# Patient Record
Sex: Female | Born: 2000 | Race: White | Hispanic: No | Marital: Single | Smoking: Never smoker
Health system: Southern US, Community
[De-identification: ages and names within clinical notes are randomized; demographics above are authoritative.]

---
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
## 2021-01-18 ENCOUNTER — Ambulatory Visit (INDEPENDENT_AMBULATORY_CARE_PROVIDER_SITE_OTHER): Payer: 59 | Admitting: Clinical

## 2021-01-18 ENCOUNTER — Ambulatory Visit: Payer: 59 | Admitting: Clinical

## 2021-01-18 DIAGNOSIS — F4323 Adjustment disorder with mixed anxiety and depressed mood: Secondary | ICD-10-CM

## 2021-01-22 ENCOUNTER — Ambulatory Visit: Payer: 59 | Admitting: Clinical

## 2021-01-29 ENCOUNTER — Other Ambulatory Visit: Payer: Self-pay

## 2021-01-29 ENCOUNTER — Emergency Department (HOSPITAL_COMMUNITY)
Admission: EM | Admit: 2021-01-29 | Discharge: 2021-01-30 | Disposition: A | Discharge status: Other Acute Inpatient Hospital | Payer: 59 | Attending: Emergency Medicine | Admitting: Emergency Medicine

## 2021-01-29 DIAGNOSIS — F332 Major depressive disorder, recurrent severe without psychotic features: Secondary | ICD-10-CM | POA: Insufficient documentation

## 2021-01-29 DIAGNOSIS — F909 Attention-deficit hyperactivity disorder, unspecified type: Secondary | ICD-10-CM | POA: Diagnosis not present

## 2021-01-29 DIAGNOSIS — R000 Tachycardia, unspecified: Secondary | ICD-10-CM | POA: Insufficient documentation

## 2021-01-29 DIAGNOSIS — T450X2A Poisoning by antiallergic and antiemetic drugs, intentional self-harm, initial encounter: Secondary | ICD-10-CM | POA: Diagnosis present

## 2021-01-29 DIAGNOSIS — Z20822 Contact with and (suspected) exposure to covid-19: Secondary | ICD-10-CM | POA: Diagnosis not present

## 2021-01-29 DIAGNOSIS — R45851 Suicidal ideations: Secondary | ICD-10-CM

## 2021-01-29 DIAGNOSIS — T50902A Poisoning by unspecified drugs, medicaments and biological substances, intentional self-harm, initial encounter: Secondary | ICD-10-CM

## 2021-01-29 LAB — RESP PANEL BY RT-PCR (FLU A&B, COVID) ARPGX2
Influenza A by PCR: NEGATIVE
Influenza B by PCR: NEGATIVE
SARS Coronavirus 2 by RT PCR: NEGATIVE

## 2021-01-29 LAB — ACETAMINOPHEN LEVEL
Acetaminophen (Tylenol), Serum: <10 ug/mL — ABNORMAL LOW (ref 10–30)
Acetaminophen (Tylenol), Serum: <10 ug/mL — ABNORMAL LOW (ref 10–30)

## 2021-01-29 LAB — RAPID URINE DRUG SCREEN, HOSP PERFORMED
Amphetamines: NOT DETECTED
Barbiturates: NOT DETECTED
Benzodiazepines: NOT DETECTED
Cocaine: NOT DETECTED
Opiates: NOT DETECTED
Tetrahydrocannabinol: NOT DETECTED

## 2021-01-29 LAB — SALICYLATE LEVEL: Salicylate Lvl: <7 mg/dL — ABNORMAL LOW (ref 7.0–30.0)

## 2021-01-29 LAB — COMPREHENSIVE METABOLIC PANEL
ALT: 27 U/L (ref 0–44)
AST: 46 U/L — ABNORMAL HIGH (ref 15–41)
Albumin: 5.6 g/dL — ABNORMAL HIGH (ref 3.5–5.0)
Alkaline Phosphatase: 62 U/L (ref 38–126)
Anion gap: 13 (ref 5–15)
BUN: 9 mg/dL (ref 6–20)
CO2: 25 mmol/L (ref 22–32)
Calcium: 9.8 mg/dL (ref 8.9–10.3)
Chloride: 101 mmol/L (ref 98–111)
Creatinine, Ser: 0.85 mg/dL (ref 0.44–1.00)
GFR, Estimated: >60 mL/min (ref >60)
Glucose, Bld: 95 mg/dL (ref 70–99)
Potassium: 4.5 mmol/L (ref 3.5–5.1)
Sodium: 139 mmol/L (ref 135–145)
Total Bilirubin: 1.2 mg/dL (ref 0.3–1.2)
Total Protein: 9.6 g/dL — ABNORMAL HIGH (ref 6.5–8.1)

## 2021-01-29 LAB — CBC
HCT: 47.2 % — ABNORMAL HIGH (ref 36.0–46.0)
Hemoglobin: 17 g/dL — ABNORMAL HIGH (ref 12.0–15.0)
MCH: 32.6 pg (ref 26.0–34.0)
MCHC: 36 g/dL (ref 30.0–36.0)
MCV: 90.6 fL (ref 80.0–100.0)
Platelets: 198 10*3/uL (ref 150–400)
RBC: 5.21 MIL/uL — ABNORMAL HIGH (ref 3.87–5.11)
RDW: 12.1 % (ref 11.5–15.5)
WBC: 5.9 10*3/uL (ref 4.0–10.5)
nRBC: 0 % (ref 0.0–0.2)

## 2021-01-29 LAB — I-STAT BETA HCG BLOOD, ED (MC, WL, AP ONLY): I-stat hCG, quantitative: <5 m[IU]/mL (ref <5)

## 2021-01-29 LAB — ETHANOL: Alcohol, Ethyl (B): <10 mg/dL (ref <10)

## 2021-01-29 MED ORDER — SODIUM CHLORIDE 0.9 % IV BOLUS
500.0000 mL | Freq: Once | INTRAVENOUS | Status: AC
  Administered 2021-01-29: 500 mL via INTRAVENOUS

## 2021-01-29 MED ORDER — SODIUM CHLORIDE 0.9 % IV BOLUS
1000.0000 mL | Freq: Once | INTRAVENOUS | Status: AC
  Administered 2021-01-29: 1000 mL via INTRAVENOUS

## 2021-01-29 MED ORDER — ESCITALOPRAM OXALATE 10 MG PO TABS
10.0000 mg | ORAL_TABLET | Freq: Every day | ORAL | Status: DC
  Administered 2021-01-30: 10 mg via ORAL
  Filled 2021-01-29: qty 1

## 2021-01-29 NOTE — ED Notes (Signed)
Called poison control. Spoke with Raynelle Fanning. Poison control recommends q4 acetaminophen level checks; draw LFT's If acetaminophen level is concerning. If QRS> 140 then give bicarbonate boluses, repeat EKG post each bolus completion. Recommends giving 1L of IV fluids for tachycardia

## 2021-01-29 NOTE — ED Provider Notes (Signed)
[facility]-EMERGENCY DEPT Provider Note   CSN: [id number] Arrival date & time: 01/29/21  7867     History Chief Complaint  Patient presents with  . Ingestion  . Suicide Attempt    Linda Franco is a 20 y.o. female with past medical history of anxiety, depression, ADHD, adjustment disorder, presenting to the emergency department from home with her mother for intentional overdose on Benadryl.  Patient took 18 tabs of 25 mg Benadryl around 130 to 2 PM today.  Patient states she was feeling depressed, and took this medication in an attempt to end her life.  She denies any other ingestions.  Denies drug or alcohol use.  Her current symptoms include dizziness.  His mother reports patient was recently found to be on the autism spectrum.  She has been taking Lexapro for 1 year now, mother believes she is been taking the medication, she administers it daily.  She has previously cut herself, however never overdosed intentionally.  The history is provided by the patient and a parent.       No past medical history on file.  Patient Active Problem List   Diagnosis Date Noted  . ADHD (attention deficit hyperactivity disorder), inattentive type 09/30/2019  . Depression 05/20/2019  . Anxiety disorder 05/20/2019  . Adjustment disorder with mixed anxiety and depressed mood 05/20/2019    No past surgical history on file.   OB History   No obstetric history on file.     No family history on file.  Social History   Tobacco Use  . Smoking status: Never Smoker  . Smokeless tobacco: Never Used    Home Medications Prior to Admission medications   Medication Sig Start Date End Date Taking? Authorizing Provider  escitalopram (LEXAPRO) 10 MG tablet TAKE 1 TABLET BY MOUTH EVERY DAY 12/21/20   Paretta-Leahey, Miachel Roux, NP    Allergies    Patient has no known allergies.  Review of Systems   Review of Systems  Neurological: Positive for dizziness.   Psychiatric/Behavioral: Positive for dysphoric mood and suicidal ideas.  All other systems reviewed and are negative.   Physical Exam Updated Vital Signs BP 132/87   Pulse (!) 102   Temp 97.7 F (36.5 C) (Oral)   Resp (!) 23   Ht 5\' 6"  (1.676 m)   Wt 53.3 kg   SpO2 100%   BMI 18.97 kg/m   Physical Exam Vitals and nursing note reviewed.  Constitutional:      Appearance: She is well-developed and well-nourished.  HENT:     Head: Normocephalic and atraumatic.  Eyes:     Extraocular Movements: Extraocular movements intact.     Conjunctiva/sclera: Conjunctivae normal.     Pupils: Pupils are equal, round, and reactive to light.  Cardiovascular:     Rate and Rhythm: Regular rhythm. Tachycardia present.  Pulmonary:     Effort: Pulmonary effort is normal. No respiratory distress.     Breath sounds: Normal breath sounds.  Abdominal:     General: Bowel sounds are normal.     Palpations: Abdomen is soft.     Tenderness: There is no abdominal tenderness.  Skin:    General: Skin is warm.  Neurological:     Mental Status: She is alert.  Psychiatric:        Mood and Affect: Mood and affect normal. Affect is flat.        Speech: Speech is delayed.        Behavior: Behavior normal.  Thought Content: Thought content includes suicidal ideation. Thought content includes suicidal plan.     ED Results / Procedures / Treatments   Labs (all labs ordered are listed, but only abnormal results are displayed) Labs Reviewed  COMPREHENSIVE METABOLIC PANEL - Abnormal; Notable for the following components:      Result Value   Total Protein 9.6 (*)    Albumin 5.6 (*)    AST 46 (*)    All other components within normal limits  CBC - Abnormal; Notable for the following components:   RBC 5.21 (*)    Hemoglobin 17.0 (*)    HCT 47.2 (*)    All other components within normal limits  ACETAMINOPHEN LEVEL - Abnormal; Notable for the following components:   Acetaminophen (Tylenol), Serum  <10 (*)    All other components within normal limits  SALICYLATE LEVEL - Abnormal; Notable for the following components:   Salicylate Lvl <7.0 (*)    All other components within normal limits  RESP PANEL BY RT-PCR (FLU A&B, COVID) ARPGX2  RAPID URINE DRUG SCREEN, HOSP PERFORMED  ETHANOL  I-STAT BETA HCG BLOOD, ED ([facility], [facility], [facility] ONLY)    EKG None  Radiology No results found.  Procedures Procedures   Medications Ordered in ED Medications  sodium chloride 0.9 % bolus 1,000 mL (0 mLs Intravenous Stopped 01/29/21 1923)    ED Course  I have reviewed the triage vital signs and the nursing notes.  Pertinent labs & imaging results that were available during my care of the patient were reviewed by me and considered in my medical decision making (see chart for details).  Clinical Course as of 01/29/21 2228  Fri Jan 29, 2021  2214 Acetaminophen (Tylenol), S(!): <10 Initial and repeat negative [HM]  2215 Pulse Rate(!): 102 Tachycardia persists.  No urinary retention.  Will give small fluid bolus. [HM]  2215 Hemoglobin(!): 17.0 Somewhat hemoconcentrated [HM]  2221 EKG 12-Lead No prolonged QRS on repeat ECG [HM]    Clinical Course User Index [HM] Muthersbaugh, Boyd Kerbs   MDM Rules/Calculators/A&P                          Patient presenting with mother for intentional overdose with Benadryl.  Patient took 18 tabs of 25 mg Benadryl from 1:32 PM today.  Endorses some dizziness on evaluation.  This ingestion was intentional suicide attempt.  We will continue to monitor.  EKG with narrow complex tachycardia.  Tachycardia improving with IV fluids.  Patient remains alert.  Labs with signs of mild dehydration, though should correct with fluids.  Negative hCG.  UDS is negative.  Tylenol levels are negative. Repeat EKG is unchanged.  Patient is medically cleared for TTS evaluation.  Currently voluntary though will need IVC if patient decides she no longer wants to stay, as she poses a risk  to herself.  Pending TTS evaluation, care is handed off at shift change to  Muthersbaugh, PA-C  Final Clinical Impression(s) / ED Diagnoses Final diagnoses:  OD (overdose of drug), intentional self-harm, initial encounter [facility])    Rx / DC Orders ED Discharge Orders    None       Robinson, [place] N, PA-C 01/29/21 2228    Gerhard Munch, MD 01/31/21 2214

## 2021-01-29 NOTE — ED Notes (Signed)
Spoke to [employer] from [employer]. She recommends to perform another EKG at 1:15am. Look at the QRS widening and if it is greater than 140, give Sodium Bicarb Bolus starting at 1-2 mEq/kg.

## 2021-01-29 NOTE — ED Notes (Signed)
Poison Control called and gave update on patient.

## 2021-01-29 NOTE — ED Provider Notes (Cosign Needed Addendum)
Care assumed from [facility].  Please see her full H&P.  In short,  Linda Franco is a 20 y.o. female presents for intentional overdose on benadryl around 1pm this afternoon. Initial provider considered patient stable for TTS evaluation.  Repeat tyelnol level (4 hr) pending.  No additional monitoring recommended by poison control. Pt is voluntary - will need IVC if she attempts to leave. Mother at bedside. Repeat ECG pending.  Physical Exam  BP 132/87   Pulse (!) 102   Temp 97.7 F (36.5 C) (Oral)   Resp (!) 23   Ht 5\' 6"  (1.676 m)   Wt 53.3 kg   SpO2 100%   BMI 18.97 kg/m   Physical Exam Vitals and nursing note reviewed.  Constitutional:      General: She is not in acute distress.    Appearance: She is well-developed and well-nourished.  HENT:     Head: Normocephalic.  Eyes:     General: No scleral icterus.    Conjunctiva/sclera: Conjunctivae normal.  Cardiovascular:     Rate and Rhythm: Normal rate.     Pulses: Intact distal pulses.     Comments: Tachycardia resolved Pulmonary:     Effort: Pulmonary effort is normal.  Musculoskeletal:        General: Normal range of motion.     Cervical back: Normal range of motion.  Skin:    General: Skin is warm and dry.  Neurological:     Mental Status: She is alert.     ED Course/Procedures   Clinical Course as of 01/30/21 0651  Fri Jan 29, 2021  2214 Acetaminophen (Tylenol), S(!): <10 Initial and repeat negative [HM]  2215 Pulse Rate(!): 102 Tachycardia persists.  No urinary retention.  Will give small fluid bolus. [HM]  2215 Hemoglobin(!): 17.0 Somewhat hemoconcentrated [HM]  2221 EKG 12-Lead No prolonged QRS on repeat ECG [HM]    Clinical Course User Index [HM] 2222     EKG Interpretation  Date/Time:  Friday January 29 2021 21:14:11 EST Ventricular Rate:  97 PR Interval:    QRS Duration: 117 QT Interval:  379 QTC Calculation: 482 R Axis:   90 Text Interpretation: Sinus rhythm  Incomplete right bundle branch block Since last tracing rate slower Confirmed by 05-21-2005 [phone]) on 01/30/2021 4:51:20 AM        EKG Interpretation  Date/Time:  Saturday January 30 2021 01:23:29 EST Ventricular Rate:  70 PR Interval:    QRS Duration: 123 QT Interval:  442 QTC Calculation: 477 R Axis:   88 Text Interpretation: Sinus rhythm Right bundle branch block Since last tracing rate slower Otherwise no significant change Confirmed by 06-27-1969 [phone]) on 01/30/2021 4:51:47 AM       EKG Interpretation  Date/Time:  Saturday January 30 2021 04:49:04 EST Ventricular Rate:  57 PR Interval:    QRS Duration: 125 QT Interval:  465 QTC Calculation: 453 R Axis:   88 Text Interpretation: Sinus rhythm  RBBB No acute changes Confirmed by 06-27-1969 [phone]) on 01/30/2021 4:53:51 AM      Procedures  MDM   Presents after intentional overdose.  Vital signs have improved after fluids.  Repeat EKG without prolonged QRS.  Per [facility] patient is cleared to be evaluated and placed by TTS.  Pending TTS assessment.  Discussed plan with patient and mother along with review of labs.  They are in agreement.  Questions answered.  4:43 AM TTS recommends: inpatient treatment  Discussed with  Cody, PA-C - concerned about ECG changes - will repeat.  QT prolongation expected with benadryl overdose, but is continuing to decrease.  QRS remains long as would be expected with Benadryl overdose.  Discussed with poison control who reports they would have expected this to have decreased.  Will repeat EKG in 4 more hours @ 9am   BP 102/68   Pulse (!) 56   Temp 98.9 F (37.2 C) (Oral)   Resp 13   Ht 5\' 6"  (1.676 m)   Wt 53.3 kg   SpO2 99%   BMI 18.97 kg/m   6:34 AM At shift change care was transferred to [facility] who will follow pending studies, re-evaulate and determine disposition.     OD (overdose of drug), intentional self-harm, initial encounter  [facility])  Suicidal ideation       Muthersbaugh, [facility] 01/30/21 02/01/21    1791, MD 01/31/21 [phone]

## 2021-01-29 NOTE — ED Triage Notes (Addendum)
Pt came from home via POV. Pt states presence of suicidal ideation for years. Today, ingested 18 tabs of 25 mg of benadryl with intent to harm self. Pt states this is the first action she has taken regarding SI. Mom bedside. PMH of autism

## 2021-01-30 ENCOUNTER — Encounter (HOSPITAL_COMMUNITY): Payer: Self-pay | Admitting: Psychiatry

## 2021-01-30 ENCOUNTER — Inpatient Hospital Stay (HOSPITAL_COMMUNITY)
Admission: AD | Admit: 2021-01-30 | Discharge: 2021-02-03 | DRG: 885 | Disposition: A | Discharge status: Home / Self Care | Payer: 59 | Source: Intra-hospital | Attending: Psychiatry | Admitting: Psychiatry

## 2021-01-30 ENCOUNTER — Other Ambulatory Visit: Payer: Self-pay

## 2021-01-30 DIAGNOSIS — F411 Generalized anxiety disorder: Secondary | ICD-10-CM | POA: Diagnosis present

## 2021-01-30 DIAGNOSIS — F909 Attention-deficit hyperactivity disorder, unspecified type: Secondary | ICD-10-CM | POA: Diagnosis present

## 2021-01-30 DIAGNOSIS — I4510 Unspecified right bundle-branch block: Secondary | ICD-10-CM | POA: Diagnosis present

## 2021-01-30 DIAGNOSIS — G4700 Insomnia, unspecified: Secondary | ICD-10-CM | POA: Diagnosis present

## 2021-01-30 DIAGNOSIS — Z20822 Contact with and (suspected) exposure to covid-19: Secondary | ICD-10-CM | POA: Diagnosis present

## 2021-01-30 DIAGNOSIS — T450X2A Poisoning by antiallergic and antiemetic drugs, intentional self-harm, initial encounter: Secondary | ICD-10-CM | POA: Diagnosis present

## 2021-01-30 DIAGNOSIS — F332 Major depressive disorder, recurrent severe without psychotic features: Principal | ICD-10-CM | POA: Diagnosis present

## 2021-01-30 DIAGNOSIS — F900 Attention-deficit hyperactivity disorder, predominantly inattentive type: Secondary | ICD-10-CM | POA: Diagnosis present

## 2021-01-30 DIAGNOSIS — F329 Major depressive disorder, single episode, unspecified: Secondary | ICD-10-CM | POA: Insufficient documentation

## 2021-01-30 LAB — HEMOGLOBIN A1C
Hgb A1c MFr Bld: 4.5 % — ABNORMAL LOW (ref 4.8–5.6)
Mean Plasma Glucose: 82.45 mg/dL

## 2021-01-30 MED ORDER — ALUM & MAG HYDROXIDE-SIMETH 200-200-20 MG/5ML PO SUSP: 30.0000 mL | ORAL | Status: DC | PRN

## 2021-01-30 MED ORDER — MAGNESIUM HYDROXIDE 400 MG/5ML PO SUSP: 30.0000 mL | Freq: Every day | ORAL | Status: DC | PRN

## 2021-01-30 MED ORDER — ACETAMINOPHEN 325 MG PO TABS
650.0000 mg | ORAL_TABLET | Freq: Four times a day (QID) | ORAL | Status: DC | PRN
  Administered 2021-01-31 – 2021-02-01 (×2): 650 mg via ORAL
  Filled 2021-01-30 (×2): qty 2

## 2021-01-30 MED ORDER — TRAZODONE HCL 50 MG PO TABS
25.0000 mg | ORAL_TABLET | Freq: Every evening | ORAL | Status: DC | PRN
  Administered 2021-01-31 – 2021-02-02 (×3): 25 mg via ORAL
  Filled 2021-01-30 (×4): qty 1

## 2021-01-30 MED ORDER — ENSURE ENLIVE PO LIQD
237.0000 mL | Freq: Two times a day (BID) | ORAL | Status: DC
  Administered 2021-01-30 – 2021-01-31 (×2): 237 mL via ORAL
  Filled 2021-01-30 (×6): qty 237

## 2021-01-30 MED ORDER — HYDROXYZINE HCL 25 MG PO TABS
25.0000 mg | ORAL_TABLET | Freq: Three times a day (TID) | ORAL | Status: DC | PRN
  Administered 2021-01-30 – 2021-02-02 (×4): 25 mg via ORAL
  Filled 2021-01-30 (×5): qty 1

## 2021-01-30 MED ORDER — ESCITALOPRAM OXALATE 10 MG PO TABS
10.0000 mg | ORAL_TABLET | Freq: Two times a day (BID) | ORAL | Status: DC
  Administered 2021-01-30 – 2021-02-03 (×8): 10 mg via ORAL
  Filled 2021-01-30 (×14): qty 1

## 2021-01-30 NOTE — H&P (Signed)
Psychiatric Admission Assessment Adult  Patient Identification: Linda Franco MRN:  [id number] Date of Evaluation:  01/30/2021 Chief Complaint:  MDD (major depressive disorder), recurrent episode, severe (HCC) [F33.2] Major depression [F32.9] Principal Diagnosis: <principal problem not specified> Diagnosis:  Active Problems:   MDD (major depressive disorder), recurrent episode, severe (HCC)   Major depression  History of Present Illness: Patient is seen and examined.  Patient is an 19 year old female who presented to the [facility] emergency department on 01/29/2021 and stated that she had been suicidal for years.  She had ingested 18 tablets of 25 mg Benadryl tablets.  She has a reported past psychiatric history significant for autism.  Her comprehensive clinical assessment note on 2/26 stated the patient had been feeling depressed secondary to several stressors.  She told me this morning that she was having problems because of "friends".  She stated she was sad.  She admitted that she was taking Lexapro for depression from a psychiatrist.  She was unsure who the psychiatrist was.  She also stated that she was in therapy.  She apparently had had recent testing that showed that she was autistic.  She stated that this was her first psychiatric admission.  She was admitted to the hospital for evaluation and stabilization.  Associated Signs/Symptoms: Depression Symptoms:  depressed mood, anhedonia, fatigue, feelings of worthlessness/guilt, difficulty concentrating, hopelessness, suicidal thoughts with specific plan, suicidal attempt, anxiety, loss of energy/fatigue, disturbed sleep, Duration of Depression Symptoms: Greater than two weeks  (Hypo) Manic Symptoms:  Impulsivity, Anxiety Symptoms:  Excessive Worry, Psychotic Symptoms:  Denied Duration of Psychotic Symptoms: No data recorded PTSD Symptoms: Had a traumatic exposure:  Patient admitted to traumatic event  in the past, but preferred not to discuss it on her initial evaluation. Total Time spent with patient: 45 minutes  Past Psychiatric History: Patient has been diagnosed with attention deficit hyperactivity disorder as well as generalized anxiety disorder.  This is her first psychiatric hospitalization.  She admitted to cutting as an adolescent and being in therapy at that time.  She stated she had not cut in approximately a year.  She is being treated with Lexapro and is in psychotherapy.  She had previously been treated with methylphenidate extended release for her ADHD.  Is the patient at risk to self? Yes.    Has the patient been a risk to self in the past 6 months? Yes.    Has the patient been a risk to self within the distant past? No.  Is the patient a risk to others? No.  Has the patient been a risk to others in the past 6 months? No.  Has the patient been a risk to others within the distant past? No.   Prior Inpatient Therapy:   Prior Outpatient Therapy:    Alcohol Screening: 1. How often do you have a drink containing alcohol?: Never 2. How many drinks containing alcohol do you have on a typical day when you are drinking?: 1 or 2 3. How often do you have six or more drinks on one occasion?: Never AUDIT-C Score: 0 4. How often during the last year have you found that you were not able to stop drinking once you had started?: Never 5. How often during the last year have you failed to do what was normally expected from you because of drinking?: Never 6. How often during the last year have you needed a first drink in the morning to get yourself going after a heavy drinking session?:  Never 7. How often during the last year have you had a feeling of guilt of remorse after drinking?: Never 8. How often during the last year have you been unable to remember what happened the night before because you had been drinking?: Never 9. Have you or someone else been injured as a result of your  drinking?: No 10. Has a relative or friend or a doctor or another health worker been concerned about your drinking or suggested you cut down?: No Alcohol Use Disorder Identification Test Final Score (AUDIT): 0 Alcohol Brief Interventions/Follow-up: AUDIT Score <7 follow-up not indicated Substance Abuse History in the last 12 months:  No. Consequences of Substance Abuse: Negative Previous Psychotropic Medications: Yes  Psychological Evaluations: Yes  Past Medical History: History reviewed. No pertinent past medical history. History reviewed. No pertinent surgical history. Family History: History reviewed. No pertinent family history. Family Psychiatric  History: She stated that her mother had an unspecified psychiatric illness, and her father had substance issues and bipolar disorder but did not seek treatment. Tobacco Screening:   Social History:  Social History   Substance and Sexual Activity  Alcohol Use None     Social History   Substance and Sexual Activity  Drug Use Not on file    Additional Social History:                           Allergies:  No Known Allergies Lab Results:  Results for orders placed or performed during the hospital encounter of 01/29/21 (from the past 48 hour(s))  Acetaminophen level     Status: Abnormal   Collection Time: 01/29/21  4:52 PM  Result Value Ref Range   Acetaminophen (Tylenol), Serum <10 (L) 10 - 30 ug/mL    Comment: (NOTE) Therapeutic concentrations vary significantly. A range of 10-30 ug/mL  may be an effective concentration for many patients. However, some  are best treated at concentrations outside of this range. Acetaminophen concentrations >150 ug/mL at 4 hours after ingestion  and >50 ug/mL at 12 hours after ingestion are often associated with  toxic reactions.  Performed at [facility], [address] [place], [place] [address]   Ethanol     Status: None   Collection Time: 01/29/21  4:52 PM   Result Value Ref Range   Alcohol, Ethyl (B) <10 <10 mg/dL    Comment: (NOTE) Lowest detectable limit for serum alcohol is 10 mg/dL.  For medical purposes only. Performed at [facility], [address] [place], [place] [address]   Salicylate level     Status: Abnormal   Collection Time: 01/29/21  4:52 PM  Result Value Ref Range   Salicylate Lvl <7.0 (L) 7.0 - 30.0 mg/dL    Comment: Performed at [facility], [address] [place], [place] [address]  Comprehensive metabolic panel     Status: Abnormal   Collection Time: 01/29/21  4:56 PM  Result Value Ref Range   Sodium 139 135 - 145 mmol/L   Potassium 4.5 3.5 - 5.1 mmol/L   Chloride 101 98 - 111 mmol/L   CO2 25 22 - 32 mmol/L   Glucose, Bld 95 70 - 99 mg/dL    Comment: Glucose reference range applies only to samples taken after fasting for at least 8 hours.   BUN 9 6 - 20 mg/dL   Creatinine, Ser 9.140.85 0.44 - 1.00 mg/dL   Calcium 9.8 8.9 - 78.210.3 mg/dL  Total Protein 9.6 (H) 6.5 - 8.1 g/dL   Albumin 5.6 (H) 3.5 - 5.0 g/dL   AST 46 (H) 15 - 41 U/L   ALT 27 0 - 44 U/L   Alkaline Phosphatase 62 38 - 126 U/L   Total Bilirubin 1.2 0.3 - 1.2 mg/dL   GFR, Estimated >70 >35 mL/min    Comment: (NOTE) Calculated using the CKD-EPI Creatinine Equation (2021)    Anion gap 13 5 - 15    Comment: Performed at [facility], [address], [place], [place] [address]  cbc     Status: Abnormal   Collection Time: 01/29/21  4:56 PM  Result Value Ref Range   WBC 5.9 4.0 - 10.5 K/uL   RBC 5.21 (H) 3.87 - 5.11 MIL/uL   Hemoglobin 17.0 (H) 12.0 - 15.0 g/dL   HCT 18.2 (H) 99.3 - 71.6 %   MCV 90.6 80.0 - 100.0 fL   MCH 32.6 26.0 - 34.0 pg   MCHC 36.0 30.0 - 36.0 g/dL   RDW 96.7 89.3 - 81.0 %   Platelets 198 150 - 400 K/uL   nRBC 0.0 0.0 - 0.2 %    Comment: Performed at [facility], [address] [place], [place] [address]  I-Stat beta hCG blood, ED     Status:  None   Collection Time: 01/29/21  5:02 PM  Result Value Ref Range   I-stat hCG, quantitative <5.0 <5 mIU/mL   Comment 3            Comment:   GEST. AGE      CONC.  (mIU/mL)   <=1 WEEK        5 - 50     2 WEEKS       50 - 500     3 WEEKS       100 - 10,000     4 WEEKS     1,000 - 30,000        FEMALE AND NON-PREGNANT FEMALE:     LESS THAN 5 mIU/mL   Rapid urine drug screen (hospital performed)     Status: None   Collection Time: 01/29/21  5:15 PM  Result Value Ref Range   Opiates NONE DETECTED NONE DETECTED   Cocaine NONE DETECTED NONE DETECTED   Benzodiazepines NONE DETECTED NONE DETECTED   Amphetamines NONE DETECTED NONE DETECTED   Tetrahydrocannabinol NONE DETECTED NONE DETECTED   Barbiturates NONE DETECTED NONE DETECTED    Comment: (NOTE) DRUG SCREEN FOR MEDICAL PURPOSES ONLY.  IF CONFIRMATION IS NEEDED FOR ANY PURPOSE, NOTIFY LAB WITHIN 5 DAYS.  LOWEST DETECTABLE LIMITS FOR URINE DRUG SCREEN Drug Class                     Cutoff (ng/mL) Amphetamine and metabolites    1000 Barbiturate and metabolites    200 Benzodiazepine                 200 Tricyclics and metabolites     300 Opiates and metabolites        300 Cocaine and metabolites        300 THC                            50 Performed at [facility], [address] [place], [place] [address]   Resp Panel by RT-PCR (Flu A&B, Covid) Nasopharyngeal Swab  Status: None   Collection Time: 01/29/21  5:42 PM   Specimen: Nasopharyngeal Swab; Nasopharyngeal(NP) swabs in vial transport medium  Result Value Ref Range   SARS Coronavirus 2 by RT PCR NEGATIVE NEGATIVE    Comment: (NOTE) SARS-CoV-2 target nucleic acids are NOT DETECTED.  The SARS-CoV-2 RNA is generally detectable in upper respiratory specimens during the acute phase of infection. The lowest concentration of SARS-CoV-2 viral copies this assay can detect is 138 copies/mL. A negative result does not preclude SARS-Cov-2 infection and  should not be used as the sole basis for treatment or other patient management decisions. A negative result may occur with  improper specimen collection/handling, submission of specimen other than nasopharyngeal swab, presence of viral mutation(s) within the areas targeted by this assay, and inadequate number of viral copies(<138 copies/mL). A negative result must be combined with clinical observations, patient history, and epidemiological information. The expected result is Negative.  Fact Sheet for Patients:  BloggerCourse.com  Fact Sheet for Healthcare Providers:  SeriousBroker.it  This test is no t yet approved or cleared by the [place] FDA and  has been authorized for detection and/or diagnosis of SARS-CoV-2 by FDA under an Emergency Use Authorization (EUA). This EUA will remain  in effect (meaning this test can be used) for the duration of the COVID-19 declaration under Section 564(b)(1) of the Act, 21 U.S.C.section 360bbb-3(b)(1), unless the authorization is terminated  or revoked sooner.       Influenza A by PCR NEGATIVE NEGATIVE   Influenza B by PCR NEGATIVE NEGATIVE    Comment: (NOTE) The Xpert Xpress SARS-CoV-2/FLU/RSV plus assay is intended as an aid in the diagnosis of influenza from Nasopharyngeal swab specimens and should not be used as a sole basis for treatment. Nasal washings and aspirates are unacceptable for Xpert Xpress SARS-CoV-2/FLU/RSV testing.  Fact Sheet for Patients: BloggerCourse.com  Fact Sheet for Healthcare Providers: SeriousBroker.it  This test is not yet approved or cleared by the [place] FDA and has been authorized for detection and/or diagnosis of SARS-CoV-2 by FDA under an Emergency Use Authorization (EUA). This EUA will remain in effect (meaning this test can be used) for the duration of the COVID-19 declaration under Section  564(b)(1) of the Act, 21 U.S.C. section 360bbb-3(b)(1), unless the authorization is terminated or revoked.  Performed at [facility], [address], [place], [place] [address]   Acetaminophen level     Status: Abnormal   Collection Time: 01/29/21  8:51 PM  Result Value Ref Range   Acetaminophen (Tylenol), Serum <10 (L) 10 - 30 ug/mL    Comment: (NOTE) Therapeutic concentrations vary significantly. A range of 10-30 ug/mL  may be an effective concentration for many patients. However, some  are best treated at concentrations outside of this range. Acetaminophen concentrations >150 ug/mL at 4 hours after ingestion  and >50 ug/mL at 12 hours after ingestion are often associated with  toxic reactions.  Performed at [facility], [address], [place], [place] [address]     Blood Alcohol level:  Lab Results  Component Value Date   ETH <10 01/29/2021    Metabolic Disorder Labs:  No results found for: HGBA1C, MPG No results found for: PROLACTIN No results found for: CHOL, TRIG, HDL, CHOLHDL, VLDL, LDLCALC  Current Medications: Current Facility-Administered Medications  Medication Dose Route Frequency Provider Last Rate Last Admin  . acetaminophen (TYLENOL) tablet 650 mg  650 mg Oral Q6H PRN Antonieta Pert, MD      .  alum & mag hydroxide-simeth (MAALOX/MYLANTA) 200-200-20 MG/5ML suspension 30 mL  30 mL Oral Q4H PRN Antonieta Pert, MD      . escitalopram (LEXAPRO) tablet 10 mg  10 mg Oral BID Antonieta Pert, MD      . hydrOXYzine (ATARAX/VISTARIL) tablet 25 mg  25 mg Oral TID PRN Antonieta Pert, MD      . magnesium hydroxide (MILK OF MAGNESIA) suspension 30 mL  30 mL Oral Daily PRN Antonieta Pert, MD      . traZODone (DESYREL) tablet 25 mg  25 mg Oral QHS PRN Antonieta Pert, MD       PTA Medications: Medications Prior to Admission  Medication Sig Dispense Refill Last Dose  . escitalopram (LEXAPRO) 10 MG tablet TAKE  1 TABLET BY MOUTH EVERY DAY 30 tablet 2     Musculoskeletal: Strength & Muscle Tone: within normal limits Gait & Station: normal Patient leans: N/A  Psychiatric Specialty Exam: Physical Exam Vitals and nursing note reviewed.  Constitutional:      Appearance: Normal appearance.  HENT:     Head: Normocephalic and atraumatic.  Pulmonary:     Effort: Pulmonary effort is normal.  Neurological:     General: No focal deficit present.     Mental Status: She is alert and oriented to person, place, and time.     Review of Systems  Blood pressure 97/66, pulse (!) 114, temperature 98.4 F (36.9 C), temperature source Oral, resp. rate 16, height 5\' 7"  (1.702 m), weight 49.9 kg, SpO2 100 %.Body mass index is 17.23 kg/m.  General Appearance: Casual  Eye Contact:  Minimal  Speech:  Normal Rate  Volume:  Decreased  Mood:  Anxious and Depressed  Affect:  Congruent  Thought Process:  Coherent and Descriptions of Associations: Intact  Orientation:  Full (Time, Place, and Person)  Thought Content:  Logical  Suicidal Thoughts:  Yes.  with intent/plan  Homicidal Thoughts:  No  Memory:  Immediate;   Fair Recent;   Fair Remote;   Fair  Judgement:  Intact  Insight:  Fair  Psychomotor Activity:  Decreased  Concentration:  Concentration: Fair  Recall:  of Knowledge:  Fair  Language:  Good  Akathisia:  Negative  Handed:  Right  AIMS (if indicated):     Assets:  Desire for Improvement Housing Resilience Social Support  ADL's:  Intact  Cognition:  WNL  Sleep:       Treatment Plan Summary: Daily contact with patient to assess and evaluate symptoms and progress in treatment, Medication management and Plan : Patient is seen and examined.  Patient is an 20 year old female with the above-stated past psychiatric history who was admitted secondary to intentional overdose of Benadryl.  She will be admitted to the hospital.  She will be integrated in the milieu.  She will be encouraged  to attend groups.  She apparently last saw her therapist on 12/11/2020.  In those notes that stated she was diagnosed with attention deficit hyperactivity disorder.  She was currently taking Lexapro 10 mg p.o. daily, and according to that note she was doing well with it.  Review of the PMP database revealed the last medication she had prescribed for her attention deficit hyperactivity disorder was on 08/31/2020.  It was methylphenidate extended release.  Review of her admission laboratories revealed essentially normal electrolytes except for a mild elevation of her AST which was 46.  CBC was essentially normal except for mildly elevated hemoglobin.  Platelets were 198,000.  Acetaminophen was less than 10.  Beta-hCG was negative.  Influenza respiratory panel was negative.  Blood alcohol was less than 10.  Drug screen was negative.  EKG showed a right bundle branch block.  We will get collateral information from her mother over what has been going on.  I am going to increase her Lexapro to 20 mg p.o. daily.  Observation Level/Precautions:  15 minute checks  Laboratory:  Chemistry Profile  Psychotherapy:    Medications:    Consultations:    Discharge Concerns:    Estimated LOS:  Other:     Physician Treatment Plan for Primary Diagnosis: <principal problem not specified> Long Term Goal(s): Improvement in symptoms so as ready for discharge  Short Term Goals: Ability to identify changes in lifestyle to reduce recurrence of condition will improve, Ability to verbalize feelings will improve, Ability to disclose and discuss suicidal ideas, Ability to demonstrate self-control will improve, Ability to identify and develop effective coping behaviors will improve and Ability to maintain clinical measurements within normal limits will improve  Physician Treatment Plan for Secondary Diagnosis: Active Problems:   MDD (major depressive disorder), recurrent episode, severe (HCC)   Major depression  Long Term Goal(s):  Improvement in symptoms so as ready for discharge  Short Term Goals: Ability to identify changes in lifestyle to reduce recurrence of condition will improve, Ability to verbalize feelings will improve, Ability to disclose and discuss suicidal ideas, Ability to demonstrate self-control will improve, Ability to identify and develop effective coping behaviors will improve and Ability to maintain clinical measurements within normal limits will improve  I certify that inpatient services furnished can reasonably be expected to improve the patient's condition.    Antonieta Pert, MD 2/26/20223:59 PM
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
## 2021-01-30 NOTE — BHH Suicide Risk Assessment (Signed)
[facility] Admission Suicide Risk Assessment   Nursing information obtained from:    Demographic factors:    Current Mental Status:    Loss Factors:    Historical Factors:    Risk Reduction Factors:     Total Time spent with patient: 30 minutes Principal Problem: <principal problem not specified> Diagnosis:  Active Problems:   MDD (major depressive disorder), recurrent episode, severe (HCC)  Subjective Data: Patient is seen and examined.  Patient is an 20 year old female who presented to the [facility] emergency department on 01/29/2021 and stated that she had been suicidal for years.  She had ingested 18 tablets of 25 mg Benadryl tablets.  She has a reported past psychiatric history significant for autism.  Her comprehensive clinical assessment note on 2/26 stated the patient had been feeling depressed secondary to several stressors.  She told me this morning that she was having problems because of "friends".  She stated she was sad.  She admitted that she was taking Lexapro for depression from a psychiatrist.  She was unsure who the psychiatrist was.  She also stated that she was in therapy.  She apparently had had recent testing that showed that she was autistic.  She stated that this was her first psychiatric admission.  She was admitted to the hospital for evaluation and stabilization.  Continued Clinical Symptoms:    The "Alcohol Use Disorders Identification Test", Guidelines for Use in Primary Care, Second Edition.  World Science writer [facility]). Score between 0-7:  no or low risk or alcohol related problems. Score between 8-15:  moderate risk of alcohol related problems. Score between 16-19:  high risk of alcohol related problems. Score 20 or above:  warrants further diagnostic evaluation for alcohol dependence and treatment.   CLINICAL FACTORS:   Severe Anxiety and/or Agitation Depression:   Anhedonia Hopelessness Impulsivity Insomnia More than one psychiatric  diagnosis   Musculoskeletal: Strength & Muscle Tone: within normal limits Gait & Station: normal Patient leans: N/A  Psychiatric Specialty Exam: Physical Exam Vitals and nursing note reviewed.  Constitutional:      Appearance: Normal appearance.  HENT:     Head: Normocephalic and atraumatic.  Pulmonary:     Effort: Pulmonary effort is normal.  Neurological:     General: No focal deficit present.     Mental Status: She is alert and oriented to person, place, and time.     Review of Systems  Blood pressure 97/66, pulse (!) 114, temperature 98.4 F (36.9 C), temperature source Oral, resp. rate 16, height 5\' 7"  (1.702 m), weight 49.9 kg, SpO2 100 %.Body mass index is 17.23 kg/m.  General Appearance: Casual  Eye Contact:  Fair  Speech:  Normal Rate  Volume:  Normal  Mood:  Anxious, Depressed and Dysphoric  Affect:  Congruent  Thought Process:  Coherent and Descriptions of Associations: Circumstantial  Orientation:  Full (Time, Place, and Person)  Thought Content:  Logical  Suicidal Thoughts:  Yes.  with intent/plan  Homicidal Thoughts:  No  Memory:  Immediate;   Fair Recent;   Fair Remote;   Fair  Judgement:  Intact  Insight:  Fair  Psychomotor Activity:  Normal  Concentration:  Concentration: Good and Attention Span: Good  Recall:  Fair  Fund of Knowledge:  Good  Language:  Good  Akathisia:  Negative  Handed:  Right  AIMS (if indicated):     Assets:  Desire for Improvement Housing Resilience Social Support  ADL's:  Intact  Cognition:  WNL  Sleep:         COGNITIVE FEATURES THAT CONTRIBUTE TO RISK:  None    SUICIDE RISK:   Moderate:  Frequent suicidal ideation with limited intensity, and duration, some specificity in terms of plans, no associated intent, good self-control, limited dysphoria/symptomatology, some risk factors present, and identifiable protective factors, including available and accessible social support.  PLAN OF CARE: Patient is seen and  examined.  Patient is an 20 year old female with the above-stated past psychiatric history who was admitted secondary to intentional overdose of Benadryl.  She will be admitted to the hospital.  She will be integrated in the milieu.  She will be encouraged to attend groups.  She apparently last saw her therapist on 12/11/2020.  In those notes that stated she was diagnosed with attention deficit hyperactivity disorder.  She was currently taking Lexapro 10 mg p.o. daily, and according to that note she was doing well with it.  Review of the PMP database revealed the last medication she had prescribed for her attention deficit hyperactivity disorder was on 08/31/2020.  It was methylphenidate extended release.  Review of her admission laboratories revealed essentially normal electrolytes except for a mild elevation of her AST which was 46.  CBC was essentially normal except for mildly elevated hemoglobin.  Platelets were 198,000.  Acetaminophen was less than 10.  Beta-hCG was negative.  Influenza respiratory panel was negative.  Blood alcohol was less than 10.  Drug screen was negative.  EKG showed a right bundle branch block.  We will get collateral information from her mother over what has been going on.  I am going to increase her Lexapro to 20 mg p.o. daily.  I certify that inpatient services furnished can reasonably be expected to improve the patient's condition.   Antonieta Pert, MD 01/30/2021, 1:31 PM

## 2021-01-30 NOTE — Tx Team (Signed)
Initial Treatment Plan 01/30/2021 3:16 PM [name] [id number]    PATIENT STRESSORS: Other: relationship stressors   PATIENT STRENGTHS: Average or above average intelligence Motivation for treatment/growth Physical Health Supportive family/friends   PATIENT IDENTIFIED PROBLEMS:  Suicidal ideation  depression     feelings of hopelessness               DISCHARGE CRITERIA:  Improved stabilization in mood, thinking, and/or behavior Verbal commitment to aftercare and medication compliance  PRELIMINARY DISCHARGE PLAN: Outpatient therapy Return to previous living arrangement  PATIENT/FAMILY INVOLVEMENT: This treatment plan has been presented to and reviewed with the patient, Linda Franco,   The patient has been given the opportunity to ask questions and make suggestions.  Shela Nevin, RN 01/30/2021, 3:16 PM

## 2021-01-30 NOTE — Progress Notes (Signed)
Reviewed the patient with Venda Rodes, [facility].  Recommend inpatient psychiatric treatment for the patient at this time. Per Binnie Rail, [facility], patient has been accepted to [facility] for inpatient psychiatric treatment once/if the patient is medically cleared.  Reviewed the patient's recent EKGs.  Initial EKG from January 29, 2021 shows QRS of 117, QTC of 482 ms, and initial automatic read shows "incomplete right bundle branch block".  Repeat EKG on January 30, 2021 shows QRS of 123, QTc of 477 ms, and initial automaticread shows "right bundle branch block".  Reviewed the patient's EKGs with the patient's EDP Dahlia Client Muthersbaugh, PA-C) via phone, who provided reassurance, stating that the patient's EKG findings on both of these EKGs is what she would expect to see in a patient who ingested as much Benadryl as this patient did. PA Muthersbaugh states that she will order a repeat EKG to make sure that there are no new/significant findings at this time. Notified PA Muthersbaugh to notify Venda Rodes, [facility] and myself of her findings regarding the repeat EKG, as well as if/when the patient is medically cleared. I verbalized understanding and agreement of this plan to PA Muthersbaugh. Notified PA Mutherabaugh that inpatient psychiatric treatment is recommended for the patient and that the patient is accepted to [facility] if/when she is medically cleared. PA Muthersbaugh verbalizes understanding and agreement of this plan.

## 2021-01-30 NOTE — ED Notes (Signed)
Spoke to [employer], [place], who recommends another EKG in 4 hours to see if the QRS is trending down.

## 2021-01-30 NOTE — Progress Notes (Signed)
Per Tresa Endo, [facility], pt has been accepted to [facility] bed 402-01. Accepting provider is Ophelia Shoulder, NP, Attending provider is Dr. Jola Babinski, MD. Per Rosey Bath, [facility] patient can arrive at 10:30am. Number for report is [phone].   Crissie Reese, MSW, LCSW-A Phone: [phone] Disposition/TOC

## 2021-01-30 NOTE — ED Provider Notes (Addendum)
Accepted handoff at shift change from [facility]. Please see prior provider note for more detail.   Briefly: Patient is 20 y.o. intentional overdose on Benadryl 1 PM yesterday afternoon.  Patient is pending a repeat EKG at 9 AM.  If QRS greater than 140 may indicate need for sodium bicarb otherwise reevaluate and medically clear. Patient is not tachycardic, normal mental status, does not have any evidence of toxidrome.  Has behavioral health bed awaiting here once medically cleared.  Physical Exam  BP 94/67    Pulse 62    Temp 98.9 F (37.2 C) (Oral)    Resp 15    Ht 5\' 6"  (1.676 m)    Wt 53.3 kg    SpO2 100%    BMI 18.97 kg/m   Physical Exam  CONSTITUTIONAL:  well-appearing, NAD NEURO:  Alert and oriented x 3, no focal deficits EYES:  pupils equal and reactive ENT/NECK:  trachea midline, no JVD CARDIO:  reg rate, reg rhythm, well-perfused PULM:  None labored breathing GI/GU:  Abdomen non-distended MSK/SPINE:  No gross deformities, no edema SKIN:  no rash obvious, atraumatic, no ecchymosis  PSYCH:  Appropriate speech and behavior   ED Course/Procedures   Clinical Course as of 01/30/21 0921  Fri Jan 29, 2021  2214 Acetaminophen (Tylenol), S(!): <10 Initial and repeat negative [HM]  2215 Pulse Rate(!): 102 Tachycardia persists.  No urinary retention.  Will give small fluid bolus. [HM]  2215 Hemoglobin(!): 17.0 Somewhat hemoconcentrated [HM]  2221 EKG 12-Lead No prolonged QRS on repeat ECG [HM]    Clinical Course User Index [HM] Muthersbaugh, 2222    Procedures Results for orders placed or performed during the hospital encounter of 01/29/21  Resp Panel by RT-PCR (Flu A&B, Covid) Nasopharyngeal Swab   Specimen: Nasopharyngeal Swab; Nasopharyngeal(NP) swabs in vial transport medium  Result Value Ref Range   SARS Coronavirus 2 by RT PCR NEGATIVE NEGATIVE   Influenza A by PCR NEGATIVE NEGATIVE   Influenza B by PCR NEGATIVE NEGATIVE  Comprehensive metabolic panel   Result Value Ref Range   Sodium 139 135 - 145 mmol/L   Potassium 4.5 3.5 - 5.1 mmol/L   Chloride 101 98 - 111 mmol/L   CO2 25 22 - 32 mmol/L   Glucose, Bld 95 70 - 99 mg/dL   BUN 9 6 - 20 mg/dL   Creatinine, Ser 01/31/21 0.44 - 1.00 mg/dL   Calcium 9.8 8.9 - 2.35 mg/dL   Total Protein 9.6 (H) 6.5 - 8.1 g/dL   Albumin 5.6 (H) 3.5 - 5.0 g/dL   AST 46 (H) 15 - 41 U/L   ALT 27 0 - 44 U/L   Alkaline Phosphatase 62 38 - 126 U/L   Total Bilirubin 1.2 0.3 - 1.2 mg/dL   GFR, Estimated 57.3 >22 mL/min   Anion gap 13 5 - 15  cbc  Result Value Ref Range   WBC 5.9 4.0 - 10.5 K/uL   RBC 5.21 (H) 3.87 - 5.11 MIL/uL   Hemoglobin 17.0 (H) 12.0 - 15.0 g/dL   HCT >02 (H) 54.2 - 70.6 %   MCV 90.6 80.0 - 100.0 fL   MCH 32.6 26.0 - 34.0 pg   MCHC 36.0 30.0 - 36.0 g/dL   RDW 23.7 62.8 - 31.5 %   Platelets 198 150 - 400 K/uL   nRBC 0.0 0.0 - 0.2 %  Rapid urine drug screen (hospital performed)  Result Value Ref Range   Opiates NONE DETECTED NONE DETECTED  Cocaine NONE DETECTED NONE DETECTED   Benzodiazepines NONE DETECTED NONE DETECTED   Amphetamines NONE DETECTED NONE DETECTED   Tetrahydrocannabinol NONE DETECTED NONE DETECTED   Barbiturates NONE DETECTED NONE DETECTED  Acetaminophen level  Result Value Ref Range   Acetaminophen (Tylenol), Serum <10 (L) 10 - 30 ug/mL  Ethanol  Result Value Ref Range   Alcohol, Ethyl (B) <10 <10 mg/dL  Salicylate level  Result Value Ref Range   Salicylate Lvl <7.0 (L) 7.0 - 30.0 mg/dL  Acetaminophen level  Result Value Ref Range   Acetaminophen (Tylenol), Serum <10 (L) 10 - 30 ug/mL  I-Stat beta hCG blood, ED  Result Value Ref Range   I-stat hCG, quantitative <5.0 <5 mIU/mL   Comment 3           No results found.  MDM   Patiently medically cleared at this time. Will consult TTSto make aware of this.  10:05 AM addendum made to include outcome of conversation with poison control.  Discussed with poison control RN who is closing case.  QRS is 122  which is improved from 5 AM EKG (125) Patient remains asymptomatic.  Discussed with patient and mother.  They are agreeable to plan to transfer to behavioral health.    Gailen Shelter, [place] 01/30/21 0923    Gailen Shelter, PA 01/30/21 1006    Alvira Monday, MD 01/31/21 [phone]

## 2021-01-30 NOTE — Progress Notes (Signed)
Repeat EKG ordered by Dahlia Client Muthersbaugh, PA-C (patient's EDP) shows QRS of 125, QTC of 453 ms, and RBBB with "no acute changes" noted. Notified by PA Muthersbaugh that poison control is requiring the patient to have another repeat EKG in 4 hours (around 9 AM). PA Muthersbaugh states that the patient will remain in the ED until the patient is fully medically cleared by poison control and the ED. Notified PA Muthersbaugh that the patient is still accepted to [facility] for inpatient psychiatric treatment if/when she is medically cleared in the future. PA Muthersbaugh verbalizes understanding and agreement of this plan.
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
## 2021-01-31 LAB — LIPID PANEL
Cholesterol: 128 mg/dL (ref 0–169)
HDL: 52 mg/dL (ref >40)
LDL Cholesterol: 69 mg/dL (ref 0–99)
Total CHOL/HDL Ratio: 2.5 RATIO
Triglycerides: 35 mg/dL (ref <150)
VLDL: 7 mg/dL (ref 0–40)

## 2021-01-31 LAB — TSH: TSH: 1.948 u[IU]/mL (ref 0.350–4.500)

## 2021-01-31 MED ORDER — NICOTINE POLACRILEX 2 MG MT GUM
2.0000 mg | CHEWING_GUM | OROMUCOSAL | Status: DC | PRN
  Administered 2021-01-31 – 2021-02-02 (×7): 2 mg via ORAL
  Filled 2021-01-31 (×5): qty 1

## 2021-01-31 MED ORDER — NICOTINE POLACRILEX 2 MG MT GUM
CHEWING_GUM | OROMUCOSAL | Status: AC
  Filled 2021-01-31: qty 1

## 2021-01-31 NOTE — [facility] Group Notes (Signed)
Adult Psychoeducational Group Not Date:  01/31/2021 Time:  0900-1045 Group Topic/Focus: PROGRESSIVE RELAXATION. A group where deep breathing is taught and tensing and relaxation muscle groups is used. Imagery is used as well.  Pts are asked to imagine 3 pillars that hold them up when they are not able to hold themselves up.  Participation Level:  Did not attend   Judge, Christine A    

## 2021-01-31 NOTE — Progress Notes (Signed)
[place] NOVEL CORONAVIRUS (COVID-19) DAILY CHECK-OFF SYMPTOMS - answer yes or no to each - every day NO YES  Have you had a fever in the past 24 hours?  . Fever (Temp > 37.80C / 100F) X   Have you had any of these symptoms in the past 24 hours? . New Cough .  Sore Throat  .  Shortness of Breath .  Difficulty Breathing .  Unexplained Body Aches   X   Have you had any one of these symptoms in the past 24 hours not related to allergies?   . Runny Nose .  Nasal Congestion .  Sneezing   X   If you have had runny nose, nasal congestion, sneezing in the past 24 hours, has it worsened?  X   EXPOSURES - check yes or no X   Have you traveled outside the state in the past 14 days?  X   Have you been in contact with someone with a confirmed diagnosis of COVID-19 or PUI in the past 14 days without wearing appropriate PPE?  X   Have you been living in the same home as a person with confirmed diagnosis of COVID-19 or a PUI (household contact)?    X   Have you been diagnosed with COVID-19?    X              What to do next: Answered NO to all: Answered YES to anything:   Proceed with unit schedule Follow the [facility] Inpatient Flowsheet.   

## 2021-01-31 NOTE — Progress Notes (Signed)
[facility] MD Progress Note  01/31/2021 2:41 PM Linda Franco  MRN:  [id number]  Subjective: Linda Franco reports, "I'm okay, doing better today".  Objective: Patient is an 20 year old female who presented to the [facility] emergency department on 01/29/2021 and stated that she had been suicidal for years. She had ingested 18 tablets of 25 mg Benadryl tablets. She has a reported past psychiatric history significant for autism. Her comprehensive clinical assessment note on 2/26 stated the patient had been feeling depressed secondary to several stressors.  Daily notes: Linda Franco is seen, chart reviewed. The chart findings discussed with the treatment team. She presents alert, oriented & aware of situation. She says she doing better today, denying any new symptoms, issues or concerns. She says she is coming out of her room & attending group sessions. She presents guarded, however, denies any SIHI, AVH, delusional thoughts or paranoia. She does not appear to be responding to any internal stimuli. No changes made on her current plan of care.  Principal Problem: MDD (major depressive disorder), recurrent episode, severe (HCC)  Diagnosis: Principal Problem:   MDD (major depressive disorder), recurrent episode, severe (HCC) Active Problems:   Major depression  Total Time spent with patient: 25 minutes  Past Psychiatric History: See H&P  Past Medical History: History reviewed. No pertinent past medical history. History reviewed. No pertinent surgical history.  Family History: History reviewed. No pertinent family history.  Family Psychiatric  History: See H&P  Social History:  Social History   Substance and Sexual Activity  Alcohol Use None     Social History   Substance and Sexual Activity  Drug Use Not on file    Social History   Socioeconomic History  . Marital status: Single    Spouse name: Not on file  . Number of children: Not on file  . Years of education: Not on file   . Highest education level: Not on file  Occupational History  . Not on file  Tobacco Use  . Smoking status: Never Smoker  . Smokeless tobacco: Never Used  Substance and Sexual Activity  . Alcohol use: Not on file  . Drug use: Not on file  . Sexual activity: Not on file  Other Topics Concern  . Not on file  Social History Narrative  . Not on file   Social Determinants of Health   Financial Resource Strain: Not on file  Food Insecurity: Not on file  Transportation Needs: Not on file  Physical Activity: Not on file  Stress: Not on file  Social Connections: Not on file   Additional Social History:   Sleep: Good  Appetite:  Good  Current Medications: Current Facility-Administered Medications  Medication Dose Route Frequency Provider Last Rate Last Admin  . acetaminophen (TYLENOL) tablet 650 mg  650 mg Oral Q6H PRN Antonieta Pert, MD   650 mg at 01/31/21 [id number]  . alum & mag hydroxide-simeth (MAALOX/MYLANTA) 200-200-20 MG/5ML suspension 30 mL  30 mL Oral Q4H PRN Antonieta Pert, MD      . escitalopram (LEXAPRO) tablet 10 mg  10 mg Oral BID Antonieta Pert, MD   10 mg at 01/31/21 3419  . feeding supplement (ENSURE ENLIVE / ENSURE PLUS) liquid 237 mL  237 mL Oral BID BM Antonieta Pert, MD   237 mL at 01/31/21 1112  . hydrOXYzine (ATARAX/VISTARIL) tablet 25 mg  25 mg Oral TID PRN Antonieta Pert, MD   25 mg at 01/30/21 1737  . magnesium hydroxide (  MILK OF MAGNESIA) suspension 30 mL  30 mL Oral Daily PRN Antonieta Pert, MD      . nicotine polacrilex (NICORETTE) 2 MG gum           . nicotine polacrilex (NICORETTE) gum 2 mg  2 mg Oral PRN Antonieta Pert, MD   2 mg at 01/31/21 1421  . traZODone (DESYREL) tablet 25 mg  25 mg Oral QHS PRN Antonieta Pert, MD       Lab Results:  Results for orders placed or performed during the hospital encounter of 01/30/21 (from the past 48 hour(s))  Hemoglobin A1c     Status: Abnormal   Collection Time: 01/30/21  5:38 PM   Result Value Ref Range   Hgb A1c MFr Bld 4.5 (L) 4.8 - 5.6 %    Comment: (NOTE) Pre diabetes:          5.7%-6.4%  Diabetes:              >6.4%  Glycemic control for   <7.0% adults with diabetes    Mean Plasma Glucose 82.45 mg/dL    Comment: Performed at [facility], [address], [place], [place] [address]  TSH     Status: None   Collection Time: 01/31/21  6:41 AM  Result Value Ref Range   TSH 1.948 0.350 - 4.500 uIU/mL    Comment: Performed by a 3rd Generation assay with a functional sensitivity of <=0.01 uIU/mL. Performed at [facility], [address] [place], [place] [address]   Lipid panel     Status: None   Collection Time: 01/31/21  6:41 AM  Result Value Ref Range   Cholesterol 128 0 - 169 mg/dL   Triglycerides 35 <098 mg/dL   HDL 52 >11 mg/dL   Total CHOL/HDL Ratio 2.5 RATIO   VLDL 7 0 - 40 mg/dL   LDL Cholesterol 69 0 - 99 mg/dL    Comment:        Total Cholesterol/HDL:CHD Risk Coronary Heart Disease Risk Table                     Men   Women  1/2 Average Risk   3.4   3.3  Average Risk       5.0   4.4  2 X Average Risk   9.6   7.1  3 X Average Risk  23.4   11.0        Use the calculated Patient Ratio above and the CHD Risk Table to determine the patient's CHD Risk.        ATP III CLASSIFICATION (LDL):  <100     mg/dL   Optimal  914-782  mg/dL   Near or Above                    Optimal  130-159  mg/dL   Borderline  956-213  mg/dL   High  >086     mg/dL   Very High Performed at [facility], [address] [place], [place] [address]    Blood Alcohol level:  Lab Results  Component Value Date   ETH <10 01/29/2021   Metabolic Disorder Labs: Lab Results  Component Value Date   HGBA1C 4.5 (L) 01/30/2021   MPG 82.45 01/30/2021   No results found for: PROLACTIN Lab Results  Component Value Date   CHOL 128 01/31/2021   TRIG 35 01/31/2021   HDL 52 01/31/2021  CHOLHDL 2.5 01/31/2021   VLDL 7  01/31/2021   LDLCALC 69 01/31/2021   Physical Findings: AIMS:  , ,  ,  ,    CIWA:    COWS:     Musculoskeletal: Strength & Muscle Tone: within normal limits Gait & Station: normal Patient leans: N/A  Psychiatric Specialty Exam: Physical Exam Vitals and nursing note reviewed.  HENT:     Head: Normocephalic.     Nose: Nose normal.     Mouth/Throat:     Pharynx: Oropharynx is clear.  Eyes:     Pupils: Pupils are equal, round, and reactive to light.  Cardiovascular:     Rate and Rhythm: Normal rate.     Pulses: Normal pulses.  Pulmonary:     Effort: Pulmonary effort is normal.  Genitourinary:    Comments: Deferred Musculoskeletal:        General: Normal range of motion.     Cervical back: Normal range of motion.  Skin:    General: Skin is warm and dry.  Neurological:     General: No focal deficit present.     Mental Status: She is alert and oriented to person, place, and time. Mental status is at baseline.     Review of Systems  Constitutional: Negative for chills, diaphoresis and fever.  HENT: Negative for congestion, rhinorrhea, sneezing and sore throat.   Eyes: Negative for discharge.  Respiratory: Negative for cough, shortness of breath and wheezing.   Cardiovascular: Negative for chest pain and palpitations.  Gastrointestinal: Negative for diarrhea, nausea and vomiting.  Endocrine: Negative for cold intolerance.  Genitourinary: Negative for difficulty urinating.  Musculoskeletal: Negative for arthralgias and myalgias.  Skin: Negative.   Allergic/Immunologic: Negative for environmental allergies and food allergies.       Allergies: NKDA  Neurological: Negative for dizziness, tremors, seizures, syncope, facial asymmetry, speech difficulty, weakness, light-headedness, numbness and headaches.  Psychiatric/Behavioral: Positive for dysphoric mood. Negative for agitation, behavioral problems, confusion, decreased concentration, hallucinations, self-injury, sleep  disturbance and suicidal ideas. The patient is not nervous/anxious and is not hyperactive.     Blood pressure 92/65, pulse 95, temperature 98.1 F (36.7 C), temperature source Oral, resp. rate 16, height 5\' 7"  (1.702 m), weight 49.9 kg, SpO2 100 %.Body mass index is 17.23 kg/m.  General Appearance: Casual  Eye Contact:  Fair  Speech:  Clear and Coherent and Normal Rate  Volume:  Decreased  Mood:  Anxious and Depressed, but improving.  Affect:  Congruent  Thought Process:  Coherent and Descriptions of Associations: Intact  Orientation:  Full (Time, Place, and Person)  Thought Content:  Logical  Suicidal Thoughts:  Denies  Homicidal Thoughts:  Denies  Memory:  Immediate;   Fair Recent;   Fair Remote;   Fair  Judgement:  Intact  Insight:  Fair  Psychomotor Activity:  Decreased  Concentration:  Concentration: Fair and Attention Span: Fair  Recall:  of Knowledge:  Fair  Language:  Good  Akathisia:  Negative  Handed:  Right  AIMS (if indicated):     Assets:  Communication Skills Desire for Improvement Physical Health Resilience Social Support  ADL's:  Intact  Cognition:  WNL  Sleep:  Number of Hours: 6.75   Treatment Plan Summary: Daily contact with patient to assess and evaluate symptoms and progress in treatment and Medication management.  Continue inpatient hospitalization. Will continue today 01/31/2021 plan as below except where it is noted.  Depression. Continue Lexapro 10 mg po bid.  Anxiety. Continue  Vistaril 25 mg po tid prn.  Insomnia. Continue Trazodone 25 mg po Q hs prn.  PRNS. Continue Tylenol 650 mg po Q 6 hrs prn for pain/fever. Continue Mylanta 30 ml po Q 4 hrs prn for indigestion. MOM 30 ml po prn daily for constipation. Continue Nicorette gum 2 mg po prn for nicotine withdrawal.  Encourage group participation. Discharge disposition plan in progress. Continue Ensure 237 ml po bid between meals.  Armandina StammerAgnes Eutha Cude, NP, PMHNP,  FNP-BC 01/31/2021, 2:41 PM

## 2021-01-31 NOTE — Progress Notes (Signed)
D: Patient presents with flat affect but is anxious upon assessment. Patient is minimal and guarded during interaction but is pleasant. Patient denies SI/HI at this time. Patient also denies AH/VH at this time. Patient contracts for safety.  A: Provided positive reinforcement and encouragement.  R: Patient receptive and cooperative to efforts. Patient remains safe on the unit.  01/30/21 2043  Psych Admission Type (Psych Patients Only)  Admission Status Voluntary  Psychosocial Assessment  Patient Complaints Anxiety  Eye Contact Brief  Facial Expression Flat  Affect Anxious  Speech Logical/coherent;Soft  Interaction Minimal;Guarded  Motor Activity Other (Comment) (WDL)  Appearance/Hygiene Unremarkable  Behavior Characteristics Cooperative;Appropriate to situation  Mood Anxious  Thought Process  Coherency WDL  Content WDL  Delusions None reported or observed  Perception WDL  Hallucination None reported or observed  Judgment Limited  Confusion None  Danger to Self  Current suicidal ideation? Denies  Danger to Others  Danger to Others None reported or observed

## 2021-01-31 NOTE — [facility] Group Notes (Signed)
Psychoeducational Group Note    Date:01/31/2021 Time: 1300-1400    Life Skills:  A group where two lists are made. What people need and what are things that we do that are healthy. The lists are developed by the patients and it is explained that we often do the actions that are not healthy to get our list of needs met.   Purpose of Group: . The group focus' on teaching patients on how to identify their needs and how to develop the coping skills needed to get their needs met  Participation Level:  Did not attend   Linda Franco

## 2021-01-31 NOTE — Progress Notes (Addendum)
RN spoke with mother Camira Geidel [phone]. Had questions regarding her daughter's treatment plan and would like the MD to reach out to her.

## 2021-01-31 NOTE — [facility] Group Notes (Signed)
[facility] LCSW Group Therapy Note  Date/Time:  01/31/2021 9:00-10:00 or 10:00-11:00AM  Type of Therapy and Topic:  Group Therapy:  Healthy and Unhealthy Supports  Participation Level:  Did Not Attend   Description of Group:  Patients in this group were introduced to the idea of adding a variety of healthy supports to address the various needs in their lives.Patients discussed what additional healthy supports could be helpful in their recovery and wellness after discharge in order to prevent future hospitalizations.   An emphasis was placed on using counselor, doctor, therapy groups, 12-step groups, and problem-specific support groups to expand supports.  They also worked as a group on developing a specific plan for several patients to deal with unhealthy supports through boundary-setting, psychoeducation with loved ones, and even termination of relationships.   Therapeutic Goals:   1)  discuss importance of adding supports to stay well once out of the hospital  2)  compare healthy versus unhealthy supports and identify some examples of each  3)  generate ideas and descriptions of healthy supports that can be added  4)  offer mutual support about how to address unhealthy supports  5)  encourage active participation in and adherence to discharge plan    Summary of Patient Progress:  The patient did not attend.  Therapeutic Modalities:   Motivational Interviewing Brief Solution-Focused Therapy  Yue Glasheen D Dakarai Mcglocklin         

## 2021-01-31 NOTE — Progress Notes (Signed)
D: Patient presents with anxious affect and is minimal upon interaction and assessment. Patient reports having a good day and attending group. Patient denies SI/HI at this time. Patient also denies AH/VH at this time. Patient contracts for safety.  A: Provided positive reinforcement and encouragement.  R: Patient cooperative and receptive to efforts. Patient remains safe on the unit.  01/31/21 2145  Psych Admission Type (Psych Patients Only)  Admission Status Voluntary  Psychosocial Assessment  Patient Complaints Anxiety  Eye Contact Brief  Facial Expression Flat  Affect Anxious  Speech Logical/coherent;Soft  Interaction Guarded;Minimal  Motor Activity Other (Comment) (WDL)  Appearance/Hygiene Unremarkable  Behavior Characteristics Cooperative;Appropriate to situation  Mood Anxious  Thought Process  Coherency WDL  Content WDL  Delusions None reported or observed  Perception WDL  Hallucination None reported or observed  Judgment Limited  Confusion None  Danger to Self  Current suicidal ideation? Denies  Danger to Others  Danger to Others None reported or observed

## 2021-01-31 NOTE — Progress Notes (Addendum)
D. Pt presents with a flat affect, anxious mood, minimal but pleasant during interactions. Per pt's self inventory, pt rated her depression, hopelessness and anxiety a 8/8/5, respectively. Pt wrote that her goal is "unlearning bad habits", and that she will do this by "talking". Pt has been visible in the milieu interacting appropriately with peers and attending groups.Pt currently denies SI/HI and AVH  A. Labs and vitals monitored. Pt given and educated on medications. Pt supported emotionally and encouraged to express concerns and ask questions.   R. Pt remains safe with 15 minute checks. Will continue POC.

## 2021-02-01 MED ORDER — ENSURE ENLIVE PO LIQD
237.0000 mL | Freq: Three times a day (TID) | ORAL | Status: DC
  Filled 2021-02-01 (×12): qty 237

## 2021-02-01 MED ORDER — ADULT MULTIVITAMIN W/MINERALS CH
1.0000 | ORAL_TABLET | Freq: Every day | ORAL | Status: DC
  Administered 2021-02-01 – 2021-02-03 (×3): 1 via ORAL
  Filled 2021-02-01 (×5): qty 1

## 2021-02-01 NOTE — [facility] Group Notes (Signed)
LCSW Group Therapy 02/01/2021  1:00pm    Type of Therapy and Topic:  Group Therapy:  Setting Goals   Participation Level:  Minimal   Description of Group: In this process group, patients discussed using strengths to work toward goals and address challenges.  Patients identified two positive things about themselves and one goal they were working on.  Patients were given the opportunity to share openly and support each other's plan for self-empowerment.  The group discussed the value of gratitude and were encouraged to have a daily reflection of positive characteristics or circumstances.  Patients were encouraged to identify a plan to utilize their strengths to work on current challenges and goals.   Therapeutic Goals 1. Patient will verbalize personal strengths/positive qualities and relate how these can assist with achieving desired personal goals 2. Patients will verbalize affirmation of peers plans for personal change and goal setting 3. Patients will explore the value of gratitude and positive focus as related to successful achievement of goals 4. Patients will verbalize a plan for regular reinforcement of personal positive qualities and circumstances.   Summary of Patient Progress: Patient identified the definition of goals. Patients was given the opportunity to share openly and support other group members' plan for self-empowerment. Patient verbalized personal strength and how they relate to achieving the desired goal. Patient was able to identify positive goals to work towards when she returns home.     Kayti came to group but referred to herself as [name].  She states that there overall goal is to better herself but did not participate in talking about smaller steps to work towards that goal.  [name] did accept the worksheets that were provided.    Therapeutic Modalities Cognitive Behavioral Therapy Motivational Interviewing

## 2021-02-01 NOTE — Tx Team (Signed)
Interdisciplinary Treatment and Diagnostic Plan Update  02/01/2021 Time of Session: 9:45am Linda Franco MRN: [id number]  Principal Diagnosis: MDD (major depressive disorder), recurrent episode, severe ([place])  Secondary Diagnoses: Principal Problem:   MDD (major depressive disorder), recurrent episode, severe ([place]) Active Problems:   Major depression   Current Medications:  Current Facility-Administered Medications  Medication Dose Route Frequency Provider Last Rate Last Admin  . acetaminophen (TYLENOL) tablet 650 mg  650 mg Oral Q6H PRN Sharma Covert, MD   650 mg at 02/01/21 0948  . alum & mag hydroxide-simeth (MAALOX/MYLANTA) 200-200-20 MG/5ML suspension 30 mL  30 mL Oral Q4H PRN Sharma Covert, MD      . escitalopram (LEXAPRO) tablet 10 mg  10 mg Oral BID Sharma Covert, MD   10 mg at 02/01/21 3532  . feeding supplement (ENSURE ENLIVE / ENSURE PLUS) liquid 237 mL  237 mL Oral TID BM Sharma Covert, MD      . hydrOXYzine (ATARAX/VISTARIL) tablet 25 mg  25 mg Oral TID PRN Sharma Covert, MD   25 mg at 01/31/21 2145  . magnesium hydroxide (MILK OF MAGNESIA) suspension 30 mL  30 mL Oral Daily PRN Sharma Covert, MD      . multivitamin with minerals tablet 1 tablet  1 tablet Oral Daily Sharma Covert, MD      . nicotine polacrilex (NICORETTE) gum 2 mg  2 mg Oral PRN Sharma Covert, MD   2 mg at 02/01/21 0955  . traZODone (DESYREL) tablet 25 mg  25 mg Oral QHS PRN Sharma Covert, MD   25 mg at 01/31/21 2145   PTA Medications: Medications Prior to Admission  Medication Sig Dispense Refill Last Dose  . escitalopram (LEXAPRO) 10 MG tablet TAKE 1 TABLET BY MOUTH EVERY DAY 30 tablet 2     Patient Stressors: Other: relationship stressors  Patient Strengths: Average or above average intelligence Motivation for treatment/growth Physical Health Supportive family/friends  Treatment Modalities: Medication Management, Group therapy, Case management,   1 to 1 session with clinician, Psychoeducation, Recreational therapy.   Physician Treatment Plan for Primary Diagnosis: MDD (major depressive disorder), recurrent episode, severe ([place]) Long Term Goal(s): Improvement in symptoms so as ready for discharge Improvement in symptoms so as ready for discharge   Short Term Goals: Ability to identify changes in lifestyle to reduce recurrence of condition will improve Ability to verbalize feelings will improve Ability to disclose and discuss suicidal ideas Ability to demonstrate self-control will improve Ability to identify and develop effective coping behaviors will improve Ability to maintain clinical measurements within normal limits will improve Ability to identify changes in lifestyle to reduce recurrence of condition will improve Ability to verbalize feelings will improve Ability to disclose and discuss suicidal ideas Ability to demonstrate self-control will improve Ability to identify and develop effective coping behaviors will improve Ability to maintain clinical measurements within normal limits will improve  Medication Management: Evaluate patient's response, side effects, and tolerance of medication regimen.  Therapeutic Interventions: 1 to 1 sessions, Unit Group sessions and Medication administration.  Evaluation of Outcomes: Not Met  Physician Treatment Plan for Secondary Diagnosis: Principal Problem:   MDD (major depressive disorder), recurrent episode, severe ([place]) Active Problems:   Major depression  Long Term Goal(s): Improvement in symptoms so as ready for discharge Improvement in symptoms so as ready for discharge   Short Term Goals: Ability to identify changes in lifestyle to reduce recurrence of condition will improve Ability to  verbalize feelings will improve Ability to disclose and discuss suicidal ideas Ability to demonstrate self-control will improve Ability to identify and develop effective coping behaviors  will improve Ability to maintain clinical measurements within normal limits will improve Ability to identify changes in lifestyle to reduce recurrence of condition will improve Ability to verbalize feelings will improve Ability to disclose and discuss suicidal ideas Ability to demonstrate self-control will improve Ability to identify and develop effective coping behaviors will improve Ability to maintain clinical measurements within normal limits will improve     Medication Management: Evaluate patient's response, side effects, and tolerance of medication regimen.  Therapeutic Interventions: 1 to 1 sessions, Unit Group sessions and Medication administration.  Evaluation of Outcomes: Not Met   RN Treatment Plan for Primary Diagnosis: MDD (major depressive disorder), recurrent episode, severe ([place]) Long Term Goal(s): Knowledge of disease and therapeutic regimen to maintain health will improve  Short Term Goals: Ability to remain free from injury will improve, Ability to verbalize frustration and anger appropriately will improve, Ability to demonstrate self-control, Ability to identify and develop effective coping behaviors will improve and Compliance with prescribed medications will improve  Medication Management: RN will administer medications as ordered by provider, will assess and evaluate patient's response and provide education to patient for prescribed medication. RN will report any adverse and/or side effects to prescribing provider.  Therapeutic Interventions: 1 on 1 counseling sessions, Psychoeducation, Medication administration, Evaluate responses to treatment, Monitor vital signs and CBGs as ordered, Perform/monitor CIWA, COWS, AIMS and Fall Risk screenings as ordered, Perform wound care treatments as ordered.  Evaluation of Outcomes: Not Met   LCSW Treatment Plan for Primary Diagnosis: MDD (major depressive disorder), recurrent episode, severe ([place]) Long Term Goal(s): Safe  transition to appropriate next level of care at discharge, Engage patient in therapeutic group addressing interpersonal concerns.  Short Term Goals: Engage patient in aftercare planning with referrals and resources, Increase social support, Increase ability to appropriately verbalize feelings, Identify triggers associated with mental health/substance abuse issues and Increase skills for wellness and recovery  Therapeutic Interventions: Assess for all discharge needs, 1 to 1 time with Social worker, Explore available resources and support systems, Assess for adequacy in community support network, Educate family and significant other(s) on suicide prevention, Complete Psychosocial Assessment, Interpersonal group therapy.  Evaluation of Outcomes: Not Met   Progress in Treatment: Attending groups: No. Participating in groups: No. Taking medication as prescribed: Yes. Toleration medication: Yes. Family/Significant other contact made: No, will contact:  mother Patient understands diagnosis: Yes. Discussing patient identified problems/goals with staff: Yes. Medical problems stabilized or resolved: Yes. Denies suicidal/homicidal ideation: Yes. Issues/concerns per patient self-inventory: No.   New problem(s) identified: No, Describe:  none  New Short Term/Long Term Goal(s): medication stabilization, elimination of SI thoughts, development of comprehensive mental wellness plan.   Patient Goals:  "Not sure. To better self"  Discharge Plan or Barriers:  Patient recently admitted. CSW will continue to follow and assess for appropriate referrals and possible discharge planning.    Reason for Continuation of Hospitalization: Anxiety Depression Medication stabilization Suicidal ideation  Estimated Length of Stay: 3-5 days  Attendees: Patient: Linda Franco 02/01/2021  Physician: Myles Lipps, MD 02/01/2021   Nursing:  02/01/2021   RN Care Manager: 02/01/2021   Social Worker: Darletta Moll, LCSW 02/01/2021   Recreational Therapist:  02/01/2021   Other:  02/01/2021   Other:  02/01/2021  Other: 02/01/2021      Scribe for Treatment Team: Linda Moselle, LCSW  02/01/2021 11:58 AM
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
## 2021-02-01 NOTE — Progress Notes (Addendum)
   02/01/21 0618  Vital Signs  Temp 98.1 F (36.7 C)  Temp Source Oral  Pulse Rate (!) 56  BP 98/65  BP Method Automatic  Oxygen Therapy  SpO2 100 %   D: Patient denies SI/HI//AVH. Patient rated anxiety 2/10 and depression 3/10. Pt. Complained of headache pain. Pt. Was out in open areas. Pt. Requested "gum" for cravings. A:  Patient took scheduled medicine. Headache pain was relieved by 650 mg of Tylenol. Pt. Refused PRN medication for anxiety.  Support and encouragement provided Routine safety checks conducted every 15 minutes. Patient  Informed to notify staff with any concerns.  R:  Safety maintained

## 2021-02-01 NOTE — Progress Notes (Signed)
[facility] MD Progress Note  02/01/2021 2:16 PM Linda Franco  MRN:  [id number] Subjective:   Linda Franco is an 19 yr old female who presents for Depression and suicide attempt (overdose dose on Benadryl). PPHx is significant for MDD, Recurrent, Severe, and ADHD.  She reports that she is doing good. She reports that she has been sleeping good and that her appetite is good. She reports that she has been dizzy and lightheaded while walking around. Discussed that her blood pressure has been low and encouraged her to increase her fluid intake. She reports she has been drinking her Ensure shakes and encouraged continuing to do so. She reports no other concerns at present.   Principal Problem: MDD (major depressive disorder), recurrent episode, severe (HCC) Diagnosis: Principal Problem:   MDD (major depressive disorder), recurrent episode, severe (HCC) Active Problems:   Major depression  Total Time spent with patient: 15 minutes  I personally spent 15 minutes on the unit in direct patient care. The direct patient care time included face-to-face time with the patient, reviewing the patient's chart, communicating with other professionals, and coordinating care. Greater than 50% of this time was spent in counseling or coordinating care with the patient regarding goals of hospitalization, psycho-education, and discharge planning needs.   Past Psychiatric History: MDD, Recurrent, Severe, and ADHD  Past Medical History: History reviewed. No pertinent past medical history. History reviewed. No pertinent surgical history. Family History: History reviewed. No pertinent family history. Family Psychiatric  History: Father- Bipolar, substance use Mother- Unspecified psychiatric illness Social History:  Social History   Substance and Sexual Activity  Alcohol Use None     Social History   Substance and Sexual Activity  Drug Use Not on file    Social History   Socioeconomic History  . Marital  status: Single    Spouse name: Not on file  . Number of children: Not on file  . Years of education: Not on file  . Highest education level: Not on file  Occupational History  . Not on file  Tobacco Use  . Smoking status: Never Smoker  . Smokeless tobacco: Never Used  Substance and Sexual Activity  . Alcohol use: Not on file  . Drug use: Not on file  . Sexual activity: Not on file  Other Topics Concern  . Not on file  Social History Narrative  . Not on file   Social Determinants of Health   Financial Resource Strain: Not on file  Food Insecurity: Not on file  Transportation Needs: Not on file  Physical Activity: Not on file  Stress: Not on file  Social Connections: Not on file   Additional Social History:                         Sleep: Good  Appetite:  Good  Current Medications: Current Facility-Administered Medications  Medication Dose Route Frequency Provider Last Rate Last Admin  . acetaminophen (TYLENOL) tablet 650 mg  650 mg Oral Q6H PRN Antonieta Pert, MD   650 mg at 02/01/21 0948  . alum & mag hydroxide-simeth (MAALOX/MYLANTA) 200-200-20 MG/5ML suspension 30 mL  30 mL Oral Q4H PRN Antonieta Pert, MD      . escitalopram (LEXAPRO) tablet 10 mg  10 mg Oral BID Antonieta Pert, MD   10 mg at 02/01/21 4098  . feeding supplement (ENSURE ENLIVE / ENSURE PLUS) liquid 237 mL  237 mL Oral TID BM Jola Babinski Marlane Mingle, MD      .  hydrOXYzine (ATARAX/VISTARIL) tablet 25 mg  25 mg Oral TID PRN Antonieta Pert, MD   25 mg at 01/31/21 2145  . magnesium hydroxide (MILK OF MAGNESIA) suspension 30 mL  30 mL Oral Daily PRN Antonieta Pert, MD      . multivitamin with minerals tablet 1 tablet  1 tablet Oral Daily Antonieta Pert, MD   1 tablet at 02/01/21 1239  . nicotine polacrilex (NICORETTE) gum 2 mg  2 mg Oral PRN Antonieta Pert, MD   2 mg at 02/01/21 1241  . traZODone (DESYREL) tablet 25 mg  25 mg Oral QHS PRN Antonieta Pert, MD   25 mg at  01/31/21 2145    Lab Results:  Results for orders placed or performed during the hospital encounter of 01/30/21 (from the past 48 hour(s))  Hemoglobin A1c     Status: Abnormal   Collection Time: 01/30/21  5:38 PM  Result Value Ref Range   Hgb A1c MFr Bld 4.5 (L) 4.8 - 5.6 %    Comment: (NOTE) Pre diabetes:          5.7%-6.4%  Diabetes:              >6.4%  Glycemic control for   <7.0% adults with diabetes    Mean Plasma Glucose 82.45 mg/dL    Comment: Performed at [facility], [address], [place], [place] [address]  TSH     Status: None   Collection Time: 01/31/21  6:41 AM  Result Value Ref Range   TSH 1.948 0.350 - 4.500 uIU/mL    Comment: Performed by a 3rd Generation assay with a functional sensitivity of <=0.01 uIU/mL. Performed at [facility], [address] [place], [place] [address]   Lipid panel     Status: None   Collection Time: 01/31/21  6:41 AM  Result Value Ref Range   Cholesterol 128 0 - 169 mg/dL   Triglycerides 35 <001 mg/dL   HDL 52 >74 mg/dL   Total CHOL/HDL Ratio 2.5 RATIO   VLDL 7 0 - 40 mg/dL   LDL Cholesterol 69 0 - 99 mg/dL    Comment:        Total Cholesterol/HDL:CHD Risk Coronary Heart Disease Risk Table                     Men   Women  1/2 Average Risk   3.4   3.3  Average Risk       5.0   4.4  2 X Average Risk   9.6   7.1  3 X Average Risk  23.4   11.0        Use the calculated Patient Ratio above and the CHD Risk Table to determine the patient's CHD Risk.        ATP III CLASSIFICATION (LDL):  <100     mg/dL   Optimal  944-967  mg/dL   Near or Above                    Optimal  130-159  mg/dL   Borderline  591-638  mg/dL   High  >466     mg/dL   Very High Performed at [facility], [address] [place], [place] [address]     Blood Alcohol level:  Lab Results  Component Value Date   [facility] <10 01/29/2021    Metabolic Disorder Labs: Lab Results  Component Value Date  HGBA1C  4.5 (L) 01/30/2021   MPG 82.45 01/30/2021   No results found for: PROLACTIN Lab Results  Component Value Date   CHOL 128 01/31/2021   TRIG 35 01/31/2021   HDL 52 01/31/2021   CHOLHDL 2.5 01/31/2021   VLDL 7 01/31/2021   LDLCALC 69 01/31/2021    Physical Findings: AIMS:  , ,  ,  ,    CIWA:    COWS:     Musculoskeletal: Strength & Muscle Tone: within normal limits Gait & Station: normal Patient leans: N/A  Psychiatric Specialty Exam: Physical Exam Vitals and nursing note reviewed.  Constitutional:      General: She is not in acute distress.    Appearance: Normal appearance. She is not ill-appearing, toxic-appearing or diaphoretic.  HENT:     Head: Normocephalic and atraumatic.  Cardiovascular:     Rate and Rhythm: Normal rate.  Pulmonary:     Effort: Pulmonary effort is normal.  Musculoskeletal:        General: Normal range of motion.  Neurological:     General: No focal deficit present.     Mental Status: She is alert.     Review of Systems  Constitutional: Negative for fatigue and fever.  Respiratory: Negative for chest tightness and shortness of breath.   Cardiovascular: Negative for chest pain and palpitations.  Gastrointestinal: Negative for abdominal pain, constipation, diarrhea, nausea and vomiting.  Neurological: Positive for dizziness, light-headedness and headaches. Negative for weakness.  Psychiatric/Behavioral: Negative for suicidal ideas.    Blood pressure (!) 89/64, pulse 96, temperature 98.1 F (36.7 C), temperature source Oral, resp. rate 16, height 5\' 7"  (1.702 m), weight 49.9 kg, SpO2 100 %.Body mass index is 17.23 kg/m.  General Appearance: Casual  Eye Contact:  Good  Speech:  Clear and Coherent and Normal Rate  Volume:  Normal  Mood:  ok  Affect:  Flat and guarded  Thought Process:  Coherent and Goal Directed  Orientation:  Full (Time, Place, and Person)  Thought Content:  Logical  Suicidal Thoughts:  No  Homicidal Thoughts:  No   Memory:  Immediate;   Good Recent;   Good  Judgement:  Fair  Insight:  Fair  Psychomotor Activity:  Normal  Concentration:  Concentration: Good and Attention Span: Good  Recall:  Good  Fund of Knowledge:  Good  Language:  Good  Akathisia:  Negative  Handed:  Right  AIMS (if indicated):     Assets:  Desire for Improvement Resilience Social Support  ADL's:  Intact  Cognition:  WNL  Sleep:  Number of Hours: 6.75     Treatment Plan Summary: Daily contact with patient to assess and evaluate symptoms and progress in treatment  Linda Franco is an 20 yr old female who presents for Depression and suicide attempt (overdose dose on Benadryl). PPHx is significant for MDD, Recurrent, Severe, and ADHD.   She has been interacting with other patients on the unit and been in the dayroom. She reports doing well with her medication, however, she does endorse some lightheadedness/dizziness when walking around, encouraged her to increase her fluid intake especially Gatorade as her BP has been soft. Will not make any changes to medications at this time. Will still plan on discharge tomorrow.   MDD, Recurrent, Severe: -Continue Lexapro 10 mg BID   Nicotine Dependence: -Continue Nicorette gum 2 mg PRN   -Continue Ensure TID -Continue PRN's: Tylenol, Maalox, Atarax, Milk of Magnesia, Trazodone   Estimated Length of Stay: 1-2 days  Lauro FranklinAlexander S Pashayan, MD 02/01/2021, 2:16 PM

## 2021-02-01 NOTE — Progress Notes (Signed)
Recreation Therapy Notes  Date:  2.28.22 Time: 0930 Location: [address] Dayroom  Group Topic: Stress Management  Goal Area(s) Addresses:  Patient will identify positive stress management techniques. Patient will identify benefits of using stress management post d/c.  Intervention: Stress Management  Activity:  Meditation.  LRT played a meditation from the Calm app that focused on making the most of your day by focusing on something you want to accomplish.  Patients were to listen and follow along meditation played to engage in activity.    Education:  Stress Management, Discharge Planning.   Education Outcome: Acknowledges Education  Clinical Observations/Feedback: Pt did not attend group activity.   Caroll Rancher, LRT/CTRS         Caroll Rancher A 02/01/2021 11:20 AM

## 2021-02-01 NOTE — Progress Notes (Signed)
NUTRITION ASSESSMENT  Pt identified as at risk on the Malnutrition Screen Tool  INTERVENTION: 1. Educated patient on the importance of nutrition and encouraged intake of food and beverages. 2. Discussed weight goals. 3. Supplements: Ensure Enlive po TID, each supplement provides 350 kcal and 20 grams of protein, MVI daily   NUTRITION DIAGNOSIS: Unintentional weight loss related to sub-optimal intake as evidenced by pt report.   Goal: Pt to meet >/= 90% of their estimated nutrition needs.  Monitor:  PO intake  Assessment:   Patient with PMH significant for severe recurrent MDD and ADHD. Presents this admission with intentional OD.   No information regarding PO intake PTA. No meal intake documented. Weigh noted to decline from 53.3 kg on 6/15 to 49.9 kg this admission (6.4% wt loss in 4 months, insignificant for time frame). Suspect malnutrition but unable to diagnose without NFPE. RD to send supplements to maximize kcal and protein this admission.   20 y.o. female  Height: Ht Readings from Last 1 Encounters:  01/30/21 5\' 7"  (1.702 m) (86 %, Z= 1.08)*   * Growth percentiles are based on CDC (Girls, 2-20 Years) data.    Weight: Wt Readings from Last 1 Encounters:  01/30/21 49.9 kg (19 %, Z= -0.88)*   * Growth percentiles are based on CDC (Girls, 2-20 Years) data.    Weight Hx: Wt Readings from Last 10 Encounters:  01/30/21 49.9 kg (19 %, Z= -0.88)*  01/29/21 53.3 kg (35 %, Z= -0.39)*  09/23/20 53.3 kg (37 %, Z= -0.34)*  05/20/19 56 kg (55 %, Z= 0.13)*  09/17/12 29.7 kg (29 %, Z= -0.54)*   * Growth percentiles are based on CDC (Girls, 2-20 Years) data.    BMI:  Body mass index is 17.23 kg/m. Pt meets criteria for underweight based on current BMI.  Estimated Nutritional Needs: Kcal: 25-30 kcal/kg Protein: > 1 gram protein/kg Fluid: 1 ml/kcal  Diet Order:  Diet Order            Diet regular Room service appropriate? Yes; Fluid consistency: Thin  Diet effective  now                Pt is also offered choice of unit snacks mid-morning and mid-afternoon.  Pt is eating as desired.   Lab results and medications reviewed.   09/19/12 RD, LDN Clinical Nutrition Pager listed in AMION

## 2021-02-02 DIAGNOSIS — F332 Major depressive disorder, recurrent severe without psychotic features: Secondary | ICD-10-CM | POA: Diagnosis not present

## 2021-02-02 NOTE — [facility] Suicide Risk Assessment (Signed)
[facility] INPATIENT:  Family/Significant Other Suicide Prevention Education  Suicide Prevention Education:  Education Completed; Parneet Glantz [phone] (Mother) has been identified by the patient as the family member/significant other with whom the patient will be residing, and identified as the person(s) who will aid the patient in the event of a mental health crisis (suicidal ideations/suicide attempt).  With written consent from the patient, the family member/significant other has been provided the following suicide prevention education, prior to the and/or following the discharge of the patient.  The suicide prevention education provided includes the following:  Suicide risk factors  Suicide prevention and interventions  National Suicide Hotline telephone number  [facility] assessment telephone number  [facility] Emergency Assistance 911  [facility] and/or Residential Mobile Crisis Unit telephone number  Request made of family/significant other to:  Remove weapons (e.g., guns, rifles, knives), all items previously/currently identified as safety concern.    Remove drugs/medications (over-the-counter, prescriptions, illicit drugs), all items previously/currently identified as a safety concern.  The family member/significant other verbalizes understanding of the suicide prevention education information provided.  The family member/significant other agrees to remove the items of safety concern listed above.  CSW spoke with Mrs. Emry who states that her daughter struggles with depression, social connections, and gender Identity.  Mrs. Manfredi states that her daughter received an Autism diagnosis in August of 2021 and she believes that this may be some of the reason for the depression and social concerns.  Mrs. Lawal states that her daughter has been on Lexapro and in therapy weekly for a year and a half.  Mrs. Everitt states that the Lexapro has not been  helpful and her daughter continues to have a flat and depressed mood.  Mrs. Rotundo states that her daughter is not in school and only works 2 times a week.  Mrs. Spielberg would like her daughter to be in PHP to help her with her mental health, social skills, and to give her something else to do during the day.  Mrs. Wawrzyniak wants to be provided the names of the agencies that will accept her daughter for [facility] and she wants to choose this agency for herself.  CSW will provide Mrs. Wardrop with the information when it is available.  Mrs. Ohalloran states that her daughter will be living with her when she is discharged and states that there are no weapons or firearms in the home.  CSW completed SPE with Mrs. Covell.   Metro Kung Revis Whalin 02/02/2021, 11:12 AM

## 2021-02-02 NOTE — Progress Notes (Cosign Needed)
Adult Psychoeducational Group Note  Date:  02/02/2021 Time:  5:04 PM  Group Topic/Focus:  Goals Group:   The focus of this group is to help patients establish daily goals to achieve during treatment and discuss how the patient can incorporate goal setting into their daily lives to aide in recovery.  Participation Level:  Active  Participation Quality:  Appropriate  Affect:  Appropriate  Cognitive:  Appropriate  Insight: Appropriate  Engagement in Group:  Engaged  Modes of Intervention:  Discussion  Additional Comments:  Pt attended group and participated in discussion.  Caren R Crotts 02/02/2021, 5:04 PM

## 2021-02-02 NOTE — Progress Notes (Signed)
Pt observed at medication window on initial contact. Presents guarded with flat affect and depressed mood. Minimal on interactions with fair eye contact. Rates her anxiety 0/10 and depression 2/10 "nothing in particular, I guess just being in here".  Pt reports she slept fair last night with good appetite. Denies SI, HI, AVH and pain at this time. Attended scheduled groups on and off unit, was engaged in activities. Emotional support, reassurance and encouragement provided to pt throughout this shift. Q 15 minutes safety checks maintained without self harm gestures or outburst to note thus far. Fluids encouraged and given due to low BP reading. Vitals rechecked and improved. Scheduled and PRN medications given with verbal education and effects monitored.  Pt tolerated her medications, fluids and meals well this shift. Denies concerns at this time. Remains safe on and off unit.

## 2021-02-02 NOTE — Progress Notes (Signed)
Pt stated she was doing better being around people     02/02/21 1037  Psych Admission Type (Psych Patients Only)  Admission Status Voluntary  Psychosocial Assessment  Patient Complaints Depression  Eye Contact Brief;Fair  Facial Expression Flat;Sad  Affect Appropriate to circumstance  Speech Logical/coherent;Soft  Interaction Guarded;Minimal  Motor Activity Slow  Appearance/Hygiene Unremarkable  Behavior Characteristics Cooperative  Mood Depressed;Sad  Thought Process  Coherency WDL  Content WDL  Delusions None reported or observed  Perception WDL  Hallucination None reported or observed  Judgment Limited  Confusion None  Danger to Self  Current suicidal ideation? Denies  Danger to Others  Danger to Others None reported or observed

## 2021-02-02 NOTE — Progress Notes (Signed)
[facility] MD Progress Note  02/02/2021 1:47 PM Linda Franco  MRN:  [id number] Subjective:   Linda Franco is an 20 yr old female who presents for Depression and suicide attempt (overdose dose on Benadryl). PPHx is significant for MDD, Recurrent, Severe, and ADHD.  She reports that she is doing good and feels better. She reported that she has been sleeping good and that her appetite is good. She reports that she has been dizzy and lightheaded while walking around but feels it is improving. Reminded her to drink plenty of fluids and Gatorade to support her BP. She reports she has been drinking her Ensure shakes and encouraged continuing to do so. She reports no other concerns at present. She is compliant with her medications and feels they are helping. She denies SI/HI/AVH, paranoia and delusions. Encouragement and support provided.   Principal Problem: MDD (major depressive disorder), recurrent episode, severe (HCC) Diagnosis: Principal Problem:   MDD (major depressive disorder), recurrent episode, severe (HCC) Active Problems:   Major depression  Total Time spent with patient: 25 minutes   Past Psychiatric History: MDD, Recurrent, Severe, and ADHD  Past Medical History: History reviewed. No pertinent past medical history. History reviewed. No pertinent surgical history. Family History: History reviewed. No pertinent family history. Family Psychiatric  History: Father- Bipolar, substance use Mother- Unspecified psychiatric illness Social History:  Social History   Substance and Sexual Activity  Alcohol Use None     Social History   Substance and Sexual Activity  Drug Use Not on file    Social History   Socioeconomic History  . Marital status: Single    Spouse name: Not on file  . Number of children: Not on file  . Years of education: Not on file  . Highest education level: Not on file  Occupational History  . Not on file  Tobacco Use  . Smoking status: Never Smoker  .  Smokeless tobacco: Never Used  Substance and Sexual Activity  . Alcohol use: Not on file  . Drug use: Not on file  . Sexual activity: Not on file  Other Topics Concern  . Not on file  Social History Narrative  . Not on file   Social Determinants of Health   Financial Resource Strain: Not on file  Food Insecurity: Not on file  Transportation Needs: Not on file  Physical Activity: Not on file  Stress: Not on file  Social Connections: Not on file   Additional Social History:      Sleep: Good  Appetite:  Good  Current Medications: Current Facility-Administered Medications  Medication Dose Route Frequency Provider Last Rate Last Admin  . acetaminophen (TYLENOL) tablet 650 mg  650 mg Oral Q6H PRN Antonieta Pert, MD   650 mg at 02/01/21 0948  . alum & mag hydroxide-simeth (MAALOX/MYLANTA) 200-200-20 MG/5ML suspension 30 mL  30 mL Oral Q4H PRN Antonieta Pert, MD      . escitalopram (LEXAPRO) tablet 10 mg  10 mg Oral BID Antonieta Pert, MD   10 mg at 02/02/21 5732  . feeding supplement (ENSURE ENLIVE / ENSURE PLUS) liquid 237 mL  237 mL Oral TID BM Antonieta Pert, MD      . hydrOXYzine (ATARAX/VISTARIL) tablet 25 mg  25 mg Oral TID PRN Antonieta Pert, MD   25 mg at 02/01/21 2109  . magnesium hydroxide (MILK OF MAGNESIA) suspension 30 mL  30 mL Oral Daily PRN Antonieta Pert, MD      .  multivitamin with minerals tablet 1 tablet  1 tablet Oral Daily Antonieta Pert, MD   1 tablet at 02/02/21 4268  . nicotine polacrilex (NICORETTE) gum 2 mg  2 mg Oral PRN Antonieta Pert, MD   2 mg at 02/02/21 1236  . traZODone (DESYREL) tablet 25 mg  25 mg Oral QHS PRN Antonieta Pert, MD   25 mg at 02/01/21 2110    Lab Results:  No results found for this or any previous visit (from the past 48 hour(s)).  Blood Alcohol level:  Lab Results  Component Value Date   ETH <10 01/29/2021    Metabolic Disorder Labs: Lab Results  Component Value Date   HGBA1C 4.5 (L)  01/30/2021   MPG 82.45 01/30/2021   No results found for: PROLACTIN Lab Results  Component Value Date   CHOL 128 01/31/2021   TRIG 35 01/31/2021   HDL 52 01/31/2021   CHOLHDL 2.5 01/31/2021   VLDL 7 01/31/2021   LDLCALC 69 01/31/2021    Physical Findings: AIMS:  , ,  ,  ,    CIWA:    COWS:     Musculoskeletal: Strength & Muscle Tone: within normal limits Gait & Station: normal Patient leans: N/A  Psychiatric Specialty Exam: Physical Exam Vitals and nursing note reviewed.  Constitutional:      General: She is not in acute distress.    Appearance: Normal appearance. She is not ill-appearing, toxic-appearing or diaphoretic.  HENT:     Head: Normocephalic and atraumatic.  Cardiovascular:     Rate and Rhythm: Normal rate.  Pulmonary:     Effort: Pulmonary effort is normal.  Musculoskeletal:        General: Normal range of motion.  Neurological:     General: No focal deficit present.     Mental Status: She is alert.     Review of Systems  Constitutional: Negative for fatigue and fever.  Respiratory: Negative for chest tightness and shortness of breath.   Cardiovascular: Negative for chest pain and palpitations.  Gastrointestinal: Negative for abdominal pain, constipation, diarrhea, nausea and vomiting.  Neurological: Positive for dizziness, light-headedness and headaches. Negative for weakness.  Psychiatric/Behavioral: Negative for suicidal ideas.    Blood pressure 101/84, pulse (!) 102, temperature 97.9 F (36.6 C), temperature source Oral, resp. rate 16, height 5\' 7"  (1.702 m), weight 49.9 kg, SpO2 97 %.Body mass index is 17.23 kg/m.  General Appearance: Casual  Eye Contact:  Good  Speech:  Clear and Coherent and Normal Rate  Volume:  Normal  Mood:  ok  Affect:  Flat and guarded  Thought Process:  Coherent and Goal Directed  Orientation:  Full (Time, Place, and Person)  Thought Content:  Logical  Suicidal Thoughts:  No  Homicidal Thoughts:  No  Memory:   Immediate;   Good Recent;   Good  Judgement:  Fair  Insight:  Fair  Psychomotor Activity:  Normal  Concentration:  Concentration: Good and Attention Span: Good  Recall:  Good  Fund of Knowledge:  Good  Language:  Good  Akathisia:  Negative  Handed:  Right  AIMS (if indicated):     Assets:  Desire for Improvement Resilience Social Support  ADL's:  Intact  Cognition:  WNL  Sleep:  Number of Hours: 6.75     Treatment Plan Summary: Daily contact with patient to assess and evaluate symptoms and progress in treatment  Linda Franco is an 20 yr old female who presents for Depression and suicide attempt (  overdose dose on Benadryl). PPHx is significant for MDD, Recurrent, Severe, and ADHD.   She has been interacting with other patients on the unit and been in the dayroom. She reports doing well with her medication, however, she does endorse some lightheadedness/dizziness when walking around, encouraged her to increase her fluid intake especially Gatorade as her BP has been soft. Will not make any changes to medications at this time. Will still plan on discharge 02/03/2021.   MDD, Recurrent, Severe: -Continue Lexapro 10 mg BID   Nicotine Dependence: -Continue Nicorette gum 2 mg PRN   -Continue Ensure TID -Continue PRN's: Tylenol, Maalox, Atarax, Milk of Magnesia, Trazodone   Estimated Length of Stay: 3-4 days   Laveda Abbe, NP 02/02/2021, 1:47 PM

## 2021-02-02 NOTE — Progress Notes (Signed)
Counseling Intern (CI) rounded on the hall  CI asked pt how she was doing and pt said she was feeling well CI asked how this experience at [facility] has been and how pt was feeling on the days leading up to admission. Pt said that she couldn't remember and that she has nothing to talk about  CI informed pt of her availability CI can follow up if needed   Leane Para  Counseling Intern @ [facility]

## 2021-02-02 NOTE — Progress Notes (Signed)
Recreation Therapy Notes  Animal-Assisted Activity (AAA) Program Checklist/Progress Notes Patient Eligibility Criteria Checklist & Daily Group note for Rec Tx Intervention  Date: 02/02/2021 Time: 3:00pm Location: [address]   AAA/T Program Assumption of Risk Form signed by Patient/ or Parent Legal Guardian YES  Patient is free of allergies or severe asthma YES  Patient reports no fear of animals YES  Patient reports no history of cruelty to animals YES  Patient understands their participation is voluntary YES  Patient washes hands before animal contact YES  Patient washes hands after animal contact YES  Behavioral Response: Active  Education: Charity fundraiser, Appropriate Animal Interaction   Education Outcome: Acknowledges understanding  Clinical Observations/Feedback: Pt attended group session and interacted appropriately with peers, Teaching laboratory technician and therapy dog, Bodi. Pt pet the therapy dog when approached by animal. Seated at table playing a card game with peer.   Ilsa Iha, LRT/CTRS Benito Mccreedy Kenyotta Dorfman 02/02/2021, 4:25 PM

## 2021-02-02 NOTE — Progress Notes (Signed)
   02/01/21 2100  Psych Admission Type (Psych Patients Only)  Admission Status Voluntary  Psychosocial Assessment  Patient Complaints None  Eye Contact Brief  Facial Expression Flat  Affect Flat  Speech Logical/coherent;Soft  Interaction Guarded;Minimal  Motor Activity Other (Comment) (wnl)  Appearance/Hygiene Unremarkable  Behavior Characteristics Cooperative  Mood Depressed;Sad  Thought Process  Coherency WDL  Content WDL  Delusions None reported or observed  Perception WDL  Hallucination None reported or observed  Judgment Limited  Confusion None  Danger to Self  Current suicidal ideation? Denies  Danger to Others  Danger to Others None reported or observed   Pt denies SI, HI, AVH and pain. Takes meds as directed. Flat affect.

## 2021-02-03 ENCOUNTER — Other Ambulatory Visit (HOSPITAL_COMMUNITY): Payer: Self-pay | Admitting: Psychiatry

## 2021-02-03 DIAGNOSIS — F332 Major depressive disorder, recurrent severe without psychotic features: Secondary | ICD-10-CM | POA: Diagnosis not present

## 2021-02-03 MED ORDER — ESCITALOPRAM OXALATE 10 MG PO TABS: 10.0000 mg | ORAL_TABLET | Freq: Two times a day (BID) | ORAL | 0 refills | Status: DC

## 2021-02-03 MED ORDER — HYDROXYZINE HCL 25 MG PO TABS
25.0000 mg | ORAL_TABLET | Freq: Three times a day (TID) | ORAL | 0 refills | Status: DC | PRN
Start: 2021-02-03 — End: 2021-04-16

## 2021-02-03 MED ORDER — TRAZODONE HCL 50 MG PO TABS: 25.0000 mg | ORAL_TABLET | Freq: Every evening | ORAL | 0 refills | Status: DC | PRN

## 2021-02-03 NOTE — BHH Suicide Risk Assessment (Signed)
[facility] Discharge Suicide Risk Assessment   Principal Problem: MDD (major depressive disorder), recurrent episode, severe (HCC) Discharge Diagnoses: Principal Problem:   MDD (major depressive disorder), recurrent episode, severe (HCC) Active Problems:   Major depression   Total Time spent with patient: 20 minutes  Musculoskeletal: Strength & Muscle Tone: within normal limits Gait & Station: normal Patient leans: N/A  Psychiatric Specialty Exam: Review of Systems  All other systems reviewed and are negative.   Blood pressure (!) 92/58, pulse 98, temperature 97.8 F (36.6 C), temperature source Oral, resp. rate 16, height 5\' 7"  (1.702 m), weight 49.9 kg, SpO2 100 %.Body mass index is 17.23 kg/m.  General Appearance: Casual  Eye Contact::  Fair  Speech:  Normal Rate409  Volume:  Normal  Mood:  Euthymic  Affect:  Congruent  Thought Process:  Coherent and Descriptions of Associations: Intact  Orientation:  Full (Time, Place, and Person)  Thought Content:  Logical  Suicidal Thoughts:  No  Homicidal Thoughts:  No  Memory:  Immediate;   Good Recent;   Good Remote;   Good  Judgement:  Intact  Insight:  Fair  Psychomotor Activity:  Normal  Concentration:  Good  Recall:  Good  Fund of Knowledge:Good  Language: Good  Akathisia:  Negative  Handed:  Right  AIMS (if indicated):     Assets:  Desire for Improvement Financial Resources/Insurance Housing Resilience Social Support Talents/Skills Transportation Vocational/Educational  Sleep:  Number of Hours: 6.75  Cognition: WNL  ADL's:  Intact   Mental Status Per Nursing Assessment::   On Admission:  Suicidal ideation indicated by others  Demographic Factors:  Adolescent or young adult, Caucasian and Unemployed  Loss Factors: NA  Historical Factors: Impulsivity  Risk Reduction Factors:   Sense of responsibility to family, Living with another person, especially a relative, Positive social support and Positive  therapeutic relationship  Continued Clinical Symptoms:  Depression:   Impulsivity  Cognitive Features That Contribute To Risk:  None    Suicide Risk:  Minimal: No identifiable suicidal ideation.  Patients presenting with no risk factors but with morbid ruminations; may be classified as minimal risk based on the severity of the depressive symptoms   Follow-up Information    [facility] Follow up on 02/23/2021.   Why: You have an appointment with 02/25/2021 for therapy on 02/23/21 at 1:00 pm.  This will be a Virtual appointment.  (Provider will contact you if there is a sooner appt) Contact information: [address] [place]  Phone: [phone], [phone]       [facility]. Go on 02/10/2021.   Why: You have an appointment for medication management services on 02/10/21 at 9:00 am with [facility]  This appointment will be held in person. Contact information: [address] [place] [phone]       BEHAVIORAL HEALTH PARTIAL HOSPITALIZATION PROGRAM Follow up.   Specialty: Behavioral Health Why: Referral made w/Jenny, [phone] Contact information: [address] [place] [address] [place] [phone]              Plan Of Care/Follow-up recommendations:  Activity:  ad lib  [phone], MD 02/03/2021, 8:26 AM

## 2021-02-03 NOTE — Plan of Care (Addendum)
Patient excited to be discharged today.

## 2021-02-03 NOTE — Progress Notes (Signed)
Recreation Therapy Notes  Date: 02/03/2021 Time: 930a Location: [address] Dayroom  Group Topic: Stress Management  Goal Area(s) Addresses:  Patient will identify positive stress management techniques. Patient will identify benefits of using stress management post d/c.  Behavioral Response: Active, Appropriate  Intervention: Relaxation, Therapeutic Coloring  Activity: Mandalas and Music. Patients were provided the choice of various mandala and positive mantra coloring pages to reflect on during the group session. Patient used colored pencils to create their own patterns and practice artistic expression. LRT used a calming playlist of instrumental music and spa sounds to promote mindfulness and relaxation.  Education:  Stress Management, Discharge Planning.   Education Outcome: Acknowledges Education  Clinical Observations/Feedback: Pt was quietly engaged in coloring for the duration of session. Pt selected coloring page with positive mantra reading "Good Vibes". Pt reported positive experience as a result of engagement in offered activity. Pt endorsed looking forward to finishing their artwork throughout the day.   Nicholos Johns Tanny Harnack, LRT/CTRS Benito Mccreedy Weston Kallman 02/03/2021, 1:11 PM

## 2021-02-03 NOTE — Progress Notes (Signed)
  [facility] Adult Case Management Discharge Plan :  Will you be returning to the same living situation after discharge:  Yes,  Home  At discharge, do you have transportation home?: Yes,  Mother Do you have the ability to pay for your medications: Yes,  Insurance   Release of information consent forms completed and in the chart;  Patient's signature needed at discharge.  Patient to Follow up at:  Follow-up Information    [facility] Follow up on 02/23/2021.   Why: You have an appointment with Liliane Shi for therapy on 02/23/21 at 1:00 pm.  This will be a Virtual appointment.  (Provider will contact you if there is a sooner appt) Contact information: [address] [place] [address]  Phone: [phone], [phone]       [facility]. Go on 02/10/2021.   Why: You have an appointment for medication management services on 02/10/21 at 9:00 am with [facility].  This appointment will be held in person. Contact information: [address] [place] [address] [phone]       BEHAVIORAL HEALTH PARTIAL HOSPITALIZATION PROGRAM Follow up.   Specialty: Behavioral Health Why: Referral made w/Jenny, [phone] Contact information: [address] [place] [address] [id number] mc [place] [address] [phone]              Next level of care provider has access to [facility] Link:yes  Safety Planning and Suicide Prevention discussed: Yes,  with mother and patient      Has patient been referred to the Quitline?: N/A patient is not a smoker  Patient has been referred for addiction treatment: Pt. refused referral  Aram Beecham, LCSWA 02/03/2021, 9:29 AM

## 2021-02-03 NOTE — Plan of Care (Signed)
Nurse discussed anxiety, depression and coping skills with patient.  

## 2021-02-03 NOTE — Progress Notes (Signed)
D:  Patient denied SI and HI, contracts for safety.  Denied A/V hallucinations.  Denied pain. A:  Medications administered per MD orders.  Emotional support and encouragement given patient. R:  Safety maintained with 15 minute checks.  

## 2021-02-03 NOTE — Progress Notes (Signed)
Discharge Note:  Patient discharged home with mother.  Patient denied SI and HI.  Denied A/V hallucinations.   Denied pain.  Suicide prevention information given and discussed with patient who stated she understood and had no questions.  Patient stated she received all her belongings, clothing, toiletries, misc items, etc.  Patient stated she appreciated all assistance received from [facility] staff.

## 2021-02-03 NOTE — Discharge Summary (Signed)
Physician Discharge Summary Note  Patient:  Linda Franco is an 20 y.o., female MRN:  [id number] DOB:  09/04/2001 Patient phone:  [phone] (home)  Patient address:   [address] [place] [id number],  Total Time spent with patient: 30 minutes  Date of Admission:  01/30/2021 Date of Discharge: 02/03/2021  Reason for Admission: Linda Franco is an 20 yr old female who presented to the [facility] emergency department on 01/29/2021 and stated that she had been suicidal for years. She had ingested 18 tablets of 25 mg Benadryl tablets. She has a reported past psychiatric history significant for autism. Her comprehensive clinical assessment note on 2/26 stated the patient had been feeling depressed secondary to several stressors. She told me this morning that she was having problems because of "friends". She stated she was sad. She admitted that she was taking Lexapro for depression from a psychiatrist. She was unsure who the psychiatrist was. She also stated that she was in therapy. She apparently had had recent testing that showed that she was autistic. She stated that this was her first psychiatric admission. She was admitted to the hospital for evaluation and stabilization.    Evaluation on the unit, day of discharge: Patient was seen, chart reviewed and case discussed with the treatment team. Patient stated she feels better today and is ready to go home. She has no complaints of feeling dizzy or lightheaded. Reminded her to continue to drink plenty of fluids. She resides with her mother and feels safe there. Her mother manages her medications. Patient denies SI/HI/AVH, paranoia and delusions. She has been visualized in the therapeutic milieu, interacting with peers and staff. She has been taking her medications as prescribed. Her appetite has been poor but she has been drinking Ensure as a nutritional supplement. She is sleeping well.  Patient is stable for discharge home today.   Principal Problem: MDD (major depressive disorder), recurrent episode, severe (HCC) Discharge Diagnoses: Principal Problem:   MDD (major depressive disorder), recurrent episode, severe (HCC) Active Problems:   Major depression  Past Psychiatric History: Patient has been diagnosed with attention deficit hyperactivity disorder as well as generalized anxiety disorder.  This is her first psychiatric hospitalization.  She admitted to cutting as an adolescent and being in therapy at that time.  She stated she had not cut in approximately a year.  She is being treated with Lexapro and is in psychotherapy.  She had previously been treated with methylphenidate extended release for her ADHD.  Past Medical History: History reviewed. No pertinent past medical history. History reviewed. No pertinent surgical history. Family History: History reviewed. No pertinent family history. Family Psychiatric  History:  She stated that her mother had an unspecified psychiatric illness, and her father had substance issues and bipolar disorder but did not seek treatment.   Social History:  Social History   Substance and Sexual Activity  Alcohol Use None     Social History   Substance and Sexual Activity  Drug Use Not on file    Social History   Socioeconomic History  . Marital status: Single    Spouse name: Not on file  . Number of children: Not on file  . Years of education: Not on file  . Highest education level: Not on file  Occupational History  . Not on file  Tobacco Use  . Smoking status: Never Smoker  . Smokeless tobacco: Never Used  Substance and Sexual Activity  . Alcohol use: Not  on file  . Drug use: Not on file  . Sexual activity: Not on file  Other Topics Concern  . Not on file  Social History Narrative  . Not on file   Social Determinants of Health   Financial Resource Strain: Not on file  Food Insecurity: Not on file  Transportation  Needs: Not on file  Physical Activity: Not on file  Stress: Not on file  Social Connections: Not on file    Hospital Course:  After the above admission evaluation, Linda Franco's presenting symptoms were noted. She was recommended for mood stabilization treatments. The medication regimen targeting those presenting symptoms were discussed with her & initiated with her consent. Her UDS on arrival to the ED was negative, alcohol level was negative. Her presenting symptoms were medicated, she was stabilized & discharged on the medications as listed on her discharge medication list below. Besides the mood stabilization treatments, Linda Franco was also enrolled & participated in the group counseling sessions being offered & held on this unit.  She has been working on coping skills to help with her anxiety and depression. She presented no other significant pre-existing medical issues that required treatment. She tolerated her treatment regimen without any adverse effects or reactions reported.   During the course of her hospitalization, the 15-minute checks were adequate to ensure patient's safety. Linda Franco did not display any dangerous, violent or suicidal behavior on the unit.  She interacted with patients & staff appropriately, participated appropriately in the group sessions/therapies. Her medications were addressed & adjusted to meet her needs. She was recommended for outpatient follow-up care & medication management upon discharge to assure continuity of care & mood stability.  At the time of discharge patient is not reporting any acute suicidal/homicidal ideations. She feels more confident about her self-care & in managing her mental health. She currently denies any new issues or concerns. Education and supportive counseling provided throughout her hospital stay & upon discharge.   Today upon her discharge evaluation with the attending psychiatrist, Linda Franco shares she is doing well. She denies any other specific  concerns. She is sleeping well. Her appetite is fair. She denies other physical complaints. She denies SI/HI/AH/VH, delusional thoughts or paranoia. She does not appear to be responding to any internal stimuli. She feels that her medications have been helpful & is in agreement to continue her current treatment regimen as recommended. She was able to engage in safety planning including plan to return to [facility] or contact emergency services if she feels unable to maintain her own safety or the safety of others. Pt had no further questions, comments, or concerns. She has follow up appointments as listed below. She left [facility] with all personal belongings in no apparent distress. Transportation will be provided by her mother who will pick her up at noon.   Physical Findings: AIMS: Facial and Oral Movements Muscles of Facial Expression: None, normal Lips and Perioral Area: None, normal Jaw: None, normal Tongue: None, normal,Extremity Movements Upper (arms, wrists, hands, fingers): None, normal Lower (legs, knees, ankles, toes): None, normal, Trunk Movements Neck, shoulders, hips: None, normal, Overall Severity Severity of abnormal movements (highest score from questions above): None, normal Incapacitation due to abnormal movements: None, normal Patient's awareness of abnormal movements (rate only patient's report): No Awareness, Dental Status Current problems with teeth and/or dentures?: No Does patient usually wear dentures?: No  CIWA:    COWS:     Musculoskeletal: Strength & Muscle Tone: within normal limits Gait & Station: normal Patient leans:  N/A  Psychiatric Specialty Exam: See MD's Discharge SRA Physical Exam Constitutional:      Appearance: Normal appearance.  HENT:     Head: Normocephalic.  Pulmonary:     Effort: Pulmonary effort is normal.  Musculoskeletal:     Cervical back: Normal range of motion.  Neurological:     Mental Status: She is alert and oriented to person, place, and  time.  Psychiatric:        Attention and Perception: Attention normal.        Mood and Affect: Mood normal.        Speech: Speech normal.        Behavior: Behavior is cooperative.        Thought Content: Thought content is not paranoid or delusional. Thought content does not include homicidal or suicidal ideation. Thought content does not include homicidal or suicidal plan.        Cognition and Memory: Cognition normal.     Review of Systems  Constitutional: Negative.   HENT: Negative.   Respiratory: Negative for cough and shortness of breath.   Gastrointestinal: Negative.   Genitourinary: Negative.     Blood pressure (!) 92/58, pulse 98, temperature 97.8 F (36.6 C), temperature source Oral, resp. rate 16, height 5\' 7"  (1.702 m), weight 49.9 kg, SpO2 100 %.Body mass index is 17.23 kg/m.  Sleep:  Number of Hours: 6.75    Has this patient used any form of tobacco in the last 30 days? (Cigarettes, Smokeless Tobacco, Cigars, and/or Pipes) Yes, Yes, A prescription for an FDA-approved tobacco cessation medication was offered at discharge and the patient refused  Blood Alcohol level:  Lab Results  Component Value Date   [facility] <10 01/29/2021    Metabolic Disorder Labs:  Lab Results  Component Value Date   HGBA1C 4.5 (L) 01/30/2021   MPG 82.45 01/30/2021   No results found for: PROLACTIN Lab Results  Component Value Date   CHOL 128 01/31/2021   TRIG 35 01/31/2021   HDL 52 01/31/2021   CHOLHDL 2.5 01/31/2021   VLDL 7 01/31/2021   LDLCALC 69 01/31/2021    See Psychiatric Specialty Exam and Suicide Risk Assessment completed by Attending Physician prior to discharge.  Discharge destination:  Home, lives with her mother  Is patient on multiple antipsychotic therapies at discharge:  No   Has Patient had three or more failed trials of antipsychotic monotherapy by history:  No  Recommended Plan for Multiple Antipsychotic Therapies: NA   Allergies as of 02/03/2021   No Known  Allergies     Medication List    TAKE these medications     Indication  escitalopram 10 MG tablet Commonly known as: LEXAPRO Take 1 tablet (10 mg total) by mouth 2 (two) times daily. What changed: when to take this  Indication: Generalized Anxiety Disorder, Major Depressive Disorder   hydrOXYzine 25 MG tablet Commonly known as: ATARAX/VISTARIL Take 1 tablet (25 mg total) by mouth 3 (three) times daily as needed for anxiety.  Indication: Feeling Anxious   traZODone 50 MG tablet Commonly known as: DESYREL Take 0.5 tablets (25 mg total) by mouth at bedtime as needed for sleep.  Indication: Trouble Sleeping       Follow-up 04/05/2021 Healthcare at [facility] Follow up on 02/23/2021.   Why: You have an appointment with 02/25/2021 for therapy on 02/23/21 at 1:00 pm.  This will be a Virtual appointment.  (Provider will contact you if there is a  sooner appt) Contact information: [address] [place] [address]  Phone: [phone], [phone]       [facility]. Go on 02/10/2021.   Why: You have an appointment for medication management services on 02/10/21 at 9:00 am with [facility].  This appointment will be held in person. Contact information: [address] [place] [address] [phone]       BEHAVIORAL HEALTH PARTIAL HOSPITALIZATION PROGRAM Follow up.   Specialty: Behavioral Health Why: Referral made w/Jenny, [phone] Contact information: [address] [place] [address] [id number] mc [place] [address] [phone]              Follow-up recommendations:  Activity:  as tolerated Diet:  Heart healthy  Comments: Prescriptions given at discharge.  Patient agreeable to plan.  Given opportunity to ask questions.  Appears to feel comfortable with discharge denies any current suicidal or homicidal thoughts.   Patient is instructed prior to discharge to: Take all medications as  prescribed by her mental healthcare provider. Report any adverse effects and or reactions from the medicines to her outpatient provider promptly. Patient has been instructed & cautioned: To not engage in alcohol and or illegal drug use while on prescription medicines. In the event of worsening symptoms, patient is instructed to call the crisis hotline, 911 and or go to the nearest ED for appropriate evaluation and treatment of symptoms. To follow-up with her primary care provider for your other medical issues, concerns and or health care needs.  Signed: Laveda Abbe, NP 02/03/2021, 9:01 AM

## 2021-02-04 ENCOUNTER — Encounter (HOSPITAL_COMMUNITY): Payer: Self-pay

## 2021-02-05 ENCOUNTER — Ambulatory Visit (INDEPENDENT_AMBULATORY_CARE_PROVIDER_SITE_OTHER): Payer: 59 | Admitting: Clinical

## 2021-02-05 DIAGNOSIS — F4323 Adjustment disorder with mixed anxiety and depressed mood: Secondary | ICD-10-CM

## 2021-02-09 ENCOUNTER — Ambulatory Visit (INDEPENDENT_AMBULATORY_CARE_PROVIDER_SITE_OTHER): Payer: 59 | Admitting: Clinical

## 2021-02-09 DIAGNOSIS — F4323 Adjustment disorder with mixed anxiety and depressed mood: Secondary | ICD-10-CM | POA: Diagnosis not present

## 2021-02-10 ENCOUNTER — Ambulatory Visit (INDEPENDENT_AMBULATORY_CARE_PROVIDER_SITE_OTHER): Payer: 59 | Admitting: Family

## 2021-02-10 ENCOUNTER — Other Ambulatory Visit: Payer: Self-pay

## 2021-02-10 ENCOUNTER — Encounter: Payer: Self-pay | Admitting: Family

## 2021-02-10 VITALS — BP 102/72 | Resp 16 | Ht 67.0 in | Wt 113.4 lb

## 2021-02-10 DIAGNOSIS — Z72821 Inadequate sleep hygiene: Secondary | ICD-10-CM

## 2021-02-10 DIAGNOSIS — F332 Major depressive disorder, recurrent severe without psychotic features: Secondary | ICD-10-CM | POA: Diagnosis not present

## 2021-02-10 DIAGNOSIS — F840 Autistic disorder: Secondary | ICD-10-CM | POA: Diagnosis not present

## 2021-02-10 DIAGNOSIS — Z79899 Other long term (current) drug therapy: Secondary | ICD-10-CM

## 2021-02-10 DIAGNOSIS — F411 Generalized anxiety disorder: Secondary | ICD-10-CM | POA: Diagnosis not present

## 2021-02-10 DIAGNOSIS — F900 Attention-deficit hyperactivity disorder, predominantly inattentive type: Secondary | ICD-10-CM

## 2021-02-10 DIAGNOSIS — Z719 Counseling, unspecified: Secondary | ICD-10-CM

## 2021-02-10 DIAGNOSIS — Z7189 Other specified counseling: Secondary | ICD-10-CM

## 2021-02-10 NOTE — Progress Notes (Signed)
[name] [address] [place] [address] Dept: [phone] Dept Fax: [phone] Loc: [phone] Loc Fax: [phone]  Medication Check  Patient ID: Linda Franco, female  DOB: 2001-09-19, 20 y.o.  MRN: [id number]  Date of Evaluation: 02/10/2021 PCP: [facility] Gabriel Blas, MD  Accompanied by: Mother Patient Lives with: parents  HISTORY/CURRENT STATUS: HPI Patient here with mother for today's visit. Patient was inpatient at [facility] recently due to increased depression with attempted suicide with overdose. Patient has been very overwhelmed with graduating early, lack of social interaction, limited time outside of the school and only working 1 day/week. Mother reports patient is processing all of the events that lead up to this event and is still not sure as to why she made the decision to attempt suicide. [facility] increased her Lexapro to 10 mg BID, but mother states she was too sleepy and wasn't able to wake her in the morning. Now only giving 10 mg in the morning and 5 mg in the evening. Not taking any other medications given on discharge. No other reported side effects.   EDUCATION: School: Graduated  Activities/ Exercise: intermittently, stretching and working out a few times/week Working: Only 1 time during the week  MEDICAL HISTORY: Appetite:  Eating 3 meals plus snacks MVI/Other: None  Sleep: Schedule has been off and more tired recently Concerns: Initiation/Maintenance/Other: None and will watch TV to help fall asleep for 30 minutes. More difficult to wake with medication change.   Individual Medical History/ Review of Systems: Changes? :Yes, [facility] recently for attempted suicide with overdose. Had   Allergies: Patient has no known allergies.  Current Medications: Current Outpatient Medications  Medication Instructions  . escitalopram  (LEXAPRO) 10 MG tablet TAKE 1 TABLET BY MOUTH TWICE A DAY  . hydrOXYzine (ATARAX/VISTARIL) 25 mg, Oral, 3 times daily PRN   Medication Side Effects: None  Family Medical/ Social History: Changes? None reported recently.   MENTAL HEALTH: Mental Health Issues: Depression and Anxiety-Seeing Dr. Dewayne Hatch 2-3 times weekly recently. Recent changes in medication with Lexapro to 15 mg daily.   PHYSICAL EXAM; Vitals:  Vitals:   02/10/21 0912  BP: 102/72  Resp: 16  Height: 5\' 7"  (1.702 m)  Weight: 113 lb 6.4 oz (51.4 kg)  BMI (Calculated): 17.76   General Physical Exam: Unchanged from previous exam, date:None Changed:None  DIAGNOSES:    ICD-10-CM   1. ADHD (attention deficit hyperactivity disorder), inattentive type  F90.0   2. Severe episode of recurrent major depressive disorder, without psychotic features (HCC)  F33.2   3. Generalized anxiety disorder  F41.1   4. Autism spectrum disorder  F84.0   5. History of difficulty sleeping  Z72.821   6. Medication management  Z79.899   7. Patient counseled  Z71.9   8. Goals of care, counseling/discussion  Z71.89     ASSESSMENT: Patient here with mother for a follow up visit after recent stay at [facility]. Attempted suicide by overdosing with 18 tablets of 25 mg Bendryl on 01/29/2021. Brought to ED by mother and transferred to [facility] for inpatient admission and discharged on 02/03/2021.  Lexapro was maintained but administered with a BID dosing of 10 mg, which was making her too sleeping and difficult to waking in the morning. Now she is only taking 5 mg in the evening with a 10 mg dose in the morning with reported efficacy. Patient requested mother not give her the  Trazodone or Hydroxyzine due to increased sleepiness from the Lexapro. Has continued with Dr. Debbe Bales for therapy on a routine basis each week. Now looking at options for partial in patient or outpatient intensive therapy to assist with her emotional dysregulation.    RECOMMENDATIONS: Discussed with patient and mother recent [facility] in patient stay along with the recent changes in thought process and emotional state that lead her to the suicide attempt.   Counseled on recent changes with school, work and social interactions that have been limiting and isolative. Working only 1 day/week and may seek out other employment. Vocational rehabilitation of [facility] may be helpful with job training, employment application/process, qualifications for local work and facilitation of work Copy. Mother and patient to look into the options available with the programs offered.  Discussed counseling with Dr. Charlyne Mom about weekly and will change to bi-weekly. Mother concerned with intensive therapy that is recommended for [name]. Discussed options with [facility] for DBT and partial in-patient in [place] at [facility] that also offers life coaching. Mother also provided information on discharge for [facility] treatment. Insurance coverage and payment options will determine placement and treatment for [name]. Mother to call facilities and insurance for coverage.   Counseled medication pharmacokinetics, options, dosage, administration, desired effects, and possible side effects.   To continue with Lexapro 10  Mg in the morning and 5 mg in the afternoon, no Rx today.  Pharmacogenetic testing discussed and swab completed for [facility] at the visit today. This will assist with medication management of her current Depression and Anxiety with history of recently tried medications without success. Information reviewed with mother and patient today with agreement on use of the testing for medication change. Mother and patient will review information once report is received by provider.   I discussed the assessment and treatment plan with the patient & parent. The patient & parent was provided an opportunity to ask questions and all were answered.  The patient/ & parent agreed with the plan and demonstrated an understanding of the instructions.  NEXT APPOINTMENT: Return in about 3 months (around 05/13/2021) for f/u visit.  Carron Curie, NP Counseling Time: 40 mins Total Contact Time: 45 mins

## 2021-02-10 NOTE — Patient Instructions (Signed)
DBT therapy at [facility], [place]  Vocational Rehabilitation for services in the [facility]

## 2021-02-12 ENCOUNTER — Ambulatory Visit (INDEPENDENT_AMBULATORY_CARE_PROVIDER_SITE_OTHER): Payer: 59 | Admitting: Clinical

## 2021-02-12 DIAGNOSIS — F4323 Adjustment disorder with mixed anxiety and depressed mood: Secondary | ICD-10-CM | POA: Diagnosis not present

## 2021-02-16 ENCOUNTER — Encounter: Payer: Self-pay | Admitting: Family

## 2021-02-18 ENCOUNTER — Ambulatory Visit (INDEPENDENT_AMBULATORY_CARE_PROVIDER_SITE_OTHER): Payer: 59 | Admitting: Clinical

## 2021-02-18 DIAGNOSIS — F4323 Adjustment disorder with mixed anxiety and depressed mood: Secondary | ICD-10-CM

## 2021-02-23 ENCOUNTER — Ambulatory Visit (INDEPENDENT_AMBULATORY_CARE_PROVIDER_SITE_OTHER): Payer: 59 | Admitting: Clinical

## 2021-02-23 DIAGNOSIS — G894 Chronic pain syndrome: Secondary | ICD-10-CM | POA: Diagnosis not present

## 2021-02-23 DIAGNOSIS — F419 Anxiety disorder, unspecified: Secondary | ICD-10-CM

## 2021-03-09 ENCOUNTER — Ambulatory Visit: Payer: 59 | Admitting: Clinical

## 2021-03-31 ENCOUNTER — Institutional Professional Consult (permissible substitution): Payer: 59 | Admitting: Family

## 2021-04-06 ENCOUNTER — Ambulatory Visit: Payer: 59 | Admitting: Clinical

## 2021-04-09 ENCOUNTER — Telehealth: Payer: 59 | Admitting: Family

## 2021-04-16 ENCOUNTER — Other Ambulatory Visit: Payer: Self-pay

## 2021-04-16 ENCOUNTER — Telehealth (INDEPENDENT_AMBULATORY_CARE_PROVIDER_SITE_OTHER): Payer: 59 | Admitting: Family

## 2021-04-16 ENCOUNTER — Encounter: Payer: Self-pay | Admitting: Family

## 2021-04-16 DIAGNOSIS — F900 Attention-deficit hyperactivity disorder, predominantly inattentive type: Secondary | ICD-10-CM

## 2021-04-16 DIAGNOSIS — Z719 Counseling, unspecified: Secondary | ICD-10-CM

## 2021-04-16 DIAGNOSIS — Z79899 Other long term (current) drug therapy: Secondary | ICD-10-CM

## 2021-04-16 DIAGNOSIS — F4323 Adjustment disorder with mixed anxiety and depressed mood: Secondary | ICD-10-CM | POA: Diagnosis not present

## 2021-04-16 DIAGNOSIS — F840 Autistic disorder: Secondary | ICD-10-CM | POA: Diagnosis not present

## 2021-04-16 DIAGNOSIS — Z7189 Other specified counseling: Secondary | ICD-10-CM

## 2021-04-16 DIAGNOSIS — F411 Generalized anxiety disorder: Secondary | ICD-10-CM | POA: Diagnosis not present

## 2021-04-16 MED ORDER — FLUOXETINE HCL 10 MG PO TABS: ORAL_TABLET | ORAL | 0 refills | Status: DC

## 2021-04-16 NOTE — Progress Notes (Signed)
DEVELOPMENTAL AND PSYCHOLOGICAL CENTER [facility] [address], [place]. [address] [place] [address] Dept: [phone] Dept Fax: [phone]  Medication Check visit via Virtual Video   Patient ID:  Linda Franco  female DOB: 2001-11-28   20 y.o.   MRN: [id number]   DATE:04/16/21  PCP: Linda Blas, MD  Virtual Visit via Video Note  I connected with  Linda Franco  and Linda Franco 's Mother (Name Linda Franco) on 04/17/21 at  1:00 PM EDT by a video enabled telemedicine application and verified that I am speaking with the correct person using two identifiers. Patient/Parent Location: at home   I discussed the limitations, risks, security and privacy concerns of performing an evaluation and management service by telephone and the availability of in person appointments. I also discussed with the parents that there may be a patient responsible charge related to this service. The parents expressed understanding and agreed to proceed.  Provider: Carron Curie, NP  Location: private private work location  HPI/CURRENT STATUS: Linda Franco is here for medication management of the psychoactive medications for ADHD and review of educational and behavioral concerns.   Aleda currently taking Lexapro 15 mg daily, which is not effective with treatment for her depressed mood. Takes medication as directed daily with no side effects reported by patient.  Arianna is eating well (eating breakfast, lunch and dinner). Eating normal with 3 meals each day, eating snacks on occasion. Left overs, fast food and some eating out at restaurants, chips for "munchies"  Sleeping well (getting plenty of sleep), sleeping through the night. Sleeping excessively now and likes to sleep.   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 Video games sometimes each day for a few hours.   MEDICAL HISTORY: Individual Medical History/ Review of Systems: Partial Hospitalization with [facility] for 4 weeks now part of the IOP plan for step down.   Family Medical/ Social History: Changes? No Patient Lives with: parents and sibling  MENTAL HEALTH: Mental Health Issues:   Depression and Anxiety-symptoms have continued with current treatment with Lexapro. Attending outpatient therapy that will end next week.   Allergies: No Known Allergies  Current Medications:  Current Outpatient Medications  Medication Instructions  . [START ON 04/30/2021] FLUoxetine (PROZAC) 10 MG tablet Take 0.5 tablets (5 mg total) by mouth daily for 14 days, THEN 1 tablet (10 mg total) daily for 21 days.   Medication Side Effects: None  DIAGNOSES:    ICD-10-CM   1. ADHD (attention deficit hyperactivity disorder), inattentive type  F90.0   2. Adjustment disorder with mixed anxiety and depressed mood  F43.23   3. Generalized anxiety disorder  F41.1   4. Autism spectrum disorder  F84.0   5. Medication management  Z79.899   6. Patient counseled  Z71.9   7. Goals of care, counseling/discussion  Z71.89    ASSESSMENT: Patient recently attended [facility] partial in patient hospitalization for 4 weeks. Now attending outpatient therapy. No medication management concurrent with therapy for her anxiety and depression. Patient reports continue anxiety and depression with minimal efficacy on Lexapro. Patient not currently working and focusing on therapy at this time to assist with her coping mechanisms. No other reported medical changes since last visit. Eating and sleeping with no concerns. No plan for therapy with routine weekly f/u after IOP program is over this  coming week. Recommendations for f/u with weekly DBT therapy after program is complete and will suggest f/u with [facility]. Medication change needed to address  current symptoms to improve efficacy.   PLAN/RECOMMENDATIONS:  Mother and patient provided updates since last visit related to partial inpatient therapeutic hospital and now attending step down therapy program.   Not currently set up for therapy with weekly counseling when program ends next week. Mother encouraged to look at options with [facility] for DBT to continue.   Reviewed limited impact with program per patient. Assisting with some coping mechanisms per mother, but limited efficacy per patient with current emotional dysregulation.   Discussed family history of anxiety and depression with medication use. Also reviewed history of side effects and adverse effects. Reviewed pharmacogenetic testing results and recent medication with limited efficacy.   Encouraged mother is depression worsens to bring Robbins to [facility] for immediate treatment with evaluation.   Reviewed titration off medication over the next 1-2 weeks with instructions provided to mother and patient. Reviewed potential side effects of medication titration. To start new medication after discontinuation of Lexpro.   Counseled medication pharmacokinetics, options, dosage, administration, desired effects, and possible side effects.   Lexapro to titrate off over the next week, no Rx today Prozac 10 mg tablets, start with 1/2 tablet for 14 days then give 1 full tablet for 21 days, # 30 with no RF's.  I discussed the assessment and treatment plan with the patient & parent. The patient & parent was provided an opportunity to ask questions and all were answered. The patient & parent agreed with the plan and demonstrated an understanding of the instructions.   I provided 40 minutes of non-face-to-face time during this encounter. Completed record review for 10 minutes prior to the virtual video visit.   NEXT APPOINTMENT:  4-6 weeks to be scheduled  Return in about 6 weeks (around 05/28/2021) for f/u visit.  The patient/parent  was advised to call back or seek an in-person evaluation if the symptoms worsen or if the condition fails to improve as anticipated.   Linda Curie, NP

## 2021-04-17 ENCOUNTER — Encounter: Payer: Self-pay | Admitting: Family

## 2021-05-04 ENCOUNTER — Ambulatory Visit (INDEPENDENT_AMBULATORY_CARE_PROVIDER_SITE_OTHER): Payer: 59 | Admitting: Clinical

## 2021-05-04 DIAGNOSIS — F4323 Adjustment disorder with mixed anxiety and depressed mood: Secondary | ICD-10-CM

## 2021-05-13 ENCOUNTER — Other Ambulatory Visit: Payer: Self-pay | Admitting: Family

## 2021-05-18 ENCOUNTER — Ambulatory Visit (INDEPENDENT_AMBULATORY_CARE_PROVIDER_SITE_OTHER): Payer: 59 | Admitting: Clinical

## 2021-05-18 DIAGNOSIS — F4323 Adjustment disorder with mixed anxiety and depressed mood: Secondary | ICD-10-CM

## 2021-05-31 ENCOUNTER — Other Ambulatory Visit: Payer: Self-pay

## 2021-05-31 ENCOUNTER — Encounter: Payer: Self-pay | Admitting: Family

## 2021-05-31 ENCOUNTER — Telehealth (INDEPENDENT_AMBULATORY_CARE_PROVIDER_SITE_OTHER): Payer: 59 | Admitting: Family

## 2021-05-31 DIAGNOSIS — F411 Generalized anxiety disorder: Secondary | ICD-10-CM

## 2021-05-31 DIAGNOSIS — Z719 Counseling, unspecified: Secondary | ICD-10-CM

## 2021-05-31 DIAGNOSIS — Z79899 Other long term (current) drug therapy: Secondary | ICD-10-CM

## 2021-05-31 DIAGNOSIS — R278 Other lack of coordination: Secondary | ICD-10-CM

## 2021-05-31 DIAGNOSIS — F900 Attention-deficit hyperactivity disorder, predominantly inattentive type: Secondary | ICD-10-CM

## 2021-05-31 DIAGNOSIS — F4323 Adjustment disorder with mixed anxiety and depressed mood: Secondary | ICD-10-CM

## 2021-05-31 DIAGNOSIS — F331 Major depressive disorder, recurrent, moderate: Secondary | ICD-10-CM | POA: Diagnosis not present

## 2021-05-31 DIAGNOSIS — F840 Autistic disorder: Secondary | ICD-10-CM

## 2021-05-31 DIAGNOSIS — G4710 Hypersomnia, unspecified: Secondary | ICD-10-CM

## 2021-05-31 DIAGNOSIS — Z7189 Other specified counseling: Secondary | ICD-10-CM

## 2021-05-31 MED ORDER — FLUOXETINE HCL 20 MG PO TABS: 20.0000 mg | ORAL_TABLET | Freq: Every day | ORAL | 0 refills | Status: DC

## 2021-05-31 NOTE — Progress Notes (Signed)
[place] [facility] PSYCHOLOG[facility] [address] [place]. [address] [place] [address] Dept: [phone] Dept Fax: [phone]  Medication Check visit via Virtual Video   Patient ID:  Linda Franco  female DOB: 02-17-2001   20 y.o.   MRN: [id number]   DATE:05/31/21  PCP: Ivins Blas, MD  Virtual Visit via Video Note  I connected with  Linda Franco  and Linda Franco 's Mother (Name Linda Franco) on 05/31/21 at  1:00 PM EDT by a video enabled telemedicine application and verified that I am speaking with the correct person using two identifiers. [place]   I discussed the limitations, risks, security and privacy concerns of performing an evaluation and management service by telephone and the availability of in person appointments. I also discussed with the parents that there may be a patient responsible charge related to this service. The parents expressed understanding and agreed to proceed.  Provider: Carron Curie, NP  Location: private work location  HPI/CURRENT STATUS: Linda Franco is here for medication management of the psychoactive medications for ADHD and review of educational and behavioral concerns.   Meilin currently taking Prozac which is working well. Takes medication at 10 mg daily in the morning. Having more issues with anxiousness and level of depression with change in medication.  Aysia is eating well (eating breakfast, lunch and dinner). Eating is slightly better than before.  Sleeping well (going to bed late and hard to wake), sleeping through the night.   EDUCATION/WORK: Job searching Put in applications for work recently May apply to [place] [employer]  Activities/ Exercise: rarely Activity at [employer]  Screen time: (phone, tablet, TV, computer):  computer for learning needs.   MEDICAL HISTORY: Individual Medical History/ Review of Systems:  None  Family Medical/ Social History: Changes? No Patient Lives with: parents and sister  MENTAL HEALTH: Mental Health Issues:   Depression and Anxiety- Prozac Counseling every other week. Wanting to increase to weekly session with Charlyne Mom. More concerned with her increased depression level  Allergies: No Known Allergies  Current Medications:  Current Outpatient Medications  Medication Instructions   FLUoxetine (PROZAC) 20 mg, Oral, Daily   Medication Side Effects: None  DIAGNOSES:    ICD-10-CM   1. ADHD (attention deficit hyperactivity disorder), inattentive type  F90.0     2. Adjustment disorder with mixed anxiety and depressed mood  F43.23     3. Generalized anxiety disorder  F41.1     4. Moderate episode of recurrent major depressive disorder (HCC)  F33.1     5. Autism spectrum disorder  F84.0     6. Dysgraphia  R27.8     7. Medication management  Z79.899     8. Sleeping excessive  G47.10     9. Patient counseled  Z71.9     10. Goals of care, counseling/discussion  Z71.89      ASSESSMENT: Patient still having issues with anxiety and depression with continued counseling support needed. Seeing Jenna Mendelson bi-weekly, but wanting to increase sessions to weekly due to increase in depression symptoms. Had recently changed medications to Prozac now at 10 mg daily dose with limited efficacy at the current dose. Sleeping most of the day and staying up late at tonight with difficulty waking the next day. Eating better and no other reported changes. To increase the Prozac dose to 20 mg daily and call to f/u with updates in 2 weeks.  PLAN/RECOMMENDATIONS:  Mother and patient provided updates for work searching and applications with jobs that she may want to work at that are close by her dad's work  [name] not needing any assistance with work duties and once trained will perform her job with no issues or concerns.   Eating better since change to new medication. NO  issues or concerns with appetite or dietary restrictions.  Sleeping at least 10 hours or more depending on the night. Some night staying up later and sleeping in with difficulty waking. Discussed appropriate sleep/wake schedule and getting about 10 hours each night.    To continued supporting structure and the need for daily rotuine at home. More structure will assist with daily regimen.   Reviewed counseling needs and continuation with weekly sessions to assist with current emotional dysregulation. Suggested release of information for Dr. Dewayne Hatch to address increase in medication with symptom control.  Counseled medication pharmacokinetics, options, dosage, administration, desired effects, and possible side effects.   Prozac to increase to 20 mg daily, # 30 with no RF's.RX for above e-scribed and sent to pharmacy on record  [facility] [phone] [place], [place] - [address] [place] [address] Phone: [phone] Fax: [phone]  I discussed the assessment and treatment plan with the patient & parent. The patient & parent was provided an opportunity to ask questions and all were answered. The patient/ & parent agreed with the plan and demonstrated an understanding of the instructions.   I provided 25 minutes of non-face-to-face time during this encounter. Completed record review for 10 minutes prior to the virtual video visit.   NEXT APPOINTMENT:  Visit date not found  Return in about 3 months (around 08/31/2021) for f/u visit.  The patient & parent was advised to call back or seek an in-person evaluation if the symptoms worsen or if the condition fails to improve as anticipated.   Carron Curie, NP

## 2021-06-01 ENCOUNTER — Ambulatory Visit: Payer: 59 | Admitting: Clinical

## 2021-06-15 ENCOUNTER — Ambulatory Visit: Payer: 59 | Admitting: Clinical

## 2021-06-16 ENCOUNTER — Ambulatory Visit (INDEPENDENT_AMBULATORY_CARE_PROVIDER_SITE_OTHER): Payer: 59 | Admitting: Clinical

## 2021-06-16 DIAGNOSIS — F89 Unspecified disorder of psychological development: Secondary | ICD-10-CM

## 2021-06-29 ENCOUNTER — Ambulatory Visit (INDEPENDENT_AMBULATORY_CARE_PROVIDER_SITE_OTHER): Payer: 59 | Admitting: Clinical

## 2021-06-29 DIAGNOSIS — F4323 Adjustment disorder with mixed anxiety and depressed mood: Secondary | ICD-10-CM | POA: Diagnosis not present

## 2021-07-30 ENCOUNTER — Other Ambulatory Visit: Payer: Self-pay | Admitting: Family

## 2021-07-30 NOTE — Telephone Encounter (Signed)
Prozac 20 mg daily, # 30 no RF's.RX for above e-scribed and sent to pharmacy on record  [facility] [phone] [place], [place] - [address] [place] [address] Phone: [phone] Fax: [phone]

## 2021-08-19 ENCOUNTER — Ambulatory Visit (INDEPENDENT_AMBULATORY_CARE_PROVIDER_SITE_OTHER): Payer: 59 | Admitting: Family

## 2021-08-19 ENCOUNTER — Encounter: Payer: Self-pay | Admitting: Family

## 2021-08-19 ENCOUNTER — Other Ambulatory Visit: Payer: Self-pay

## 2021-08-19 VITALS — BP 98/62 | HR 76 | Resp 16 | Ht 67.0 in | Wt 100.4 lb

## 2021-08-19 DIAGNOSIS — F840 Autistic disorder: Secondary | ICD-10-CM

## 2021-08-19 DIAGNOSIS — F649 Gender identity disorder, unspecified: Secondary | ICD-10-CM

## 2021-08-19 DIAGNOSIS — Z79899 Other long term (current) drug therapy: Secondary | ICD-10-CM

## 2021-08-19 DIAGNOSIS — F411 Generalized anxiety disorder: Secondary | ICD-10-CM

## 2021-08-19 DIAGNOSIS — F332 Major depressive disorder, recurrent severe without psychotic features: Secondary | ICD-10-CM

## 2021-08-19 DIAGNOSIS — F900 Attention-deficit hyperactivity disorder, predominantly inattentive type: Secondary | ICD-10-CM | POA: Diagnosis not present

## 2021-08-19 DIAGNOSIS — Z7189 Other specified counseling: Secondary | ICD-10-CM

## 2021-08-19 DIAGNOSIS — Z719 Counseling, unspecified: Secondary | ICD-10-CM

## 2021-08-19 NOTE — Patient Instructions (Signed)
Counseling for [facility]- [phone] Elio Forget, MA, LCMHCS, [facility], [facility] Arrie Aran, MSW, [employer]

## 2021-08-20 ENCOUNTER — Encounter: Payer: Self-pay | Admitting: Family

## 2021-08-20 NOTE — Progress Notes (Signed)
Patient ID: Linda Franco, female   DOB: March 15, 2001, 20 y.o.   MRN: [id number] Patient ID: Linda Franco   DOB: [birth year]  MRN: [id number]   DATE:08/19/21 Nation, Gabriel Cirri, MD   Accompanied by: Mother Patient Lives with: parents   HISTORY/CURRENT STATUS: HPI Patient here with mother for the visit. Patient interactive and answering question appropriately with provider. Patient attending [employer] for 4 classes this semester in person. Not having any issues and not getting services at school. Patient has continued taking her Prozac 20 mg daily with no issues or side effects.    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  Screen time: (phone, tablet, TV, computer): Computer, movies, TV and phone.  Driving: permit and driving, may take driving test   MEDICAL HISTORY: Appetite:  No changes Sleep: Bedtime: 12:00 am the latest  Awakens: 7-9:00 am depending on the class schedule   Concerns: Initiation/Maintenance/Other: None reported, some days feels sleepy Elimination: None   Individual Medical History/ Review of Systems: Changes? :None reported..   Family Medical/ Social History: Changes? None   Current Medications:      Current Outpatient Medications  Medication Instructions   FLUoxetine (PROZAC) 20 MG tablet TAKE 1 TABLET BY MOUTH EVERY DAY    Medication Side Effects: None   MENTAL HEALTH: Anxiety and depression history with good symptom control on her Prozac 20 mg daily. Not seeing a counselor at this time and may look at other options.    PHYSICAL EXAM;    Vitals:    08/19/21 1048  BP: 98/62  Pulse: 76  Resp: 16  Weight: 100 lb 6.4 oz (45.5 kg)  Height: 5\' 7"  (1.702 m)    General Physical Exam: Unchanged from previous exam,  date:05/31/2021         Side Effects (None 0, Mild 1, Moderate 2, Severe 3)             Headache 0                  Stomachache 0             Change of appetite 0             Trouble sleeping 0             Irritability in the later morning, later afternoon , or  evening 0             Socially withdrawn - decreased interaction with others 0             Extreme sadness or unusual crying 0             Dull, tired, listless behavior 0             Tremors/feeling shaky 0 Repetitive movements, tics, jerking, twitching, eye blinking 0  Picking at skin or fingers nail biting, lip or cheek chewing:0             Sees or hears things that aren't there 0                        Comments:  none   ASSESSMENT:  [name] is a 20 year old with a diagnosis of ADHD, Anxiety and Depression, that  is improved to be effective with her symptom. Patient has reported symptom control of the Prozac 20 mg with no side effects. Attending [employer] for the fall semester with 4 classes. No current academic services in place with patient reporting no need for support at this time. Job seeking with interview today. Still struggling with her ASD but not currently in counseling having some dysphoria and needing support to navigate these feelings. Sleeping with no issues, but some days feels sleepy during the day. Eating with no changes but poor dietary choices. Lack of physical activity but walking on campus. No need to changes medication or dosing. Reassess dosing in the next 3 months.    DIAGNOSES:      ICD-10-CM    1. ADHD (attention deficit hyperactivity disorder), inattentive type  F90.0       2. Severe episode of recurrent major depressive disorder, without psychotic features (HCC)  F33.2       3. Generalized anxiety disorder  F41.1       4. Medication management  Z79.899       5. Patient counseled  Z71.9       6. Goals of care, counseling/discussion  Z71.89         RECOMMENDATIONS:  Patient and mother provided updates  with school, academic progress, services not in place and homework.  No current academic services in place at the college. Support is available and discussed with patient at the visit today.  Discussed counseling and recent decision for discontinuation. Needing support for current issues with dysphoria. Provided mother and patient alternative counseling options for current reported concerns.   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 Support and encouragement provided.   Encouraged better dietary choices and support healthier vareity of food daily with more water.   Sleep schedule reviewed with patient and being consistent with bedtime routine. Discussed sleepiness during the day and limiting naps to 30 mins or less.   NEXT APPOINTMENT:  Return in about 3 months (around 11/18/2021) for f/u visit .

## 2021-10-09 ENCOUNTER — Other Ambulatory Visit: Payer: Self-pay | Admitting: Family

## 2021-10-10 NOTE — Telephone Encounter (Signed)
Prozac 20 mg daily, # 30 with 2 RF's.RX for above e-scribed and sent to pharmacy on record  [phone] [place], [place] - [address] [place] [address] Phone: [phone] Fax: [phone]

## 2021-11-12 ENCOUNTER — Telehealth (INDEPENDENT_AMBULATORY_CARE_PROVIDER_SITE_OTHER): Payer: 59 | Admitting: Family

## 2021-11-12 ENCOUNTER — Other Ambulatory Visit: Payer: Self-pay

## 2021-11-12 ENCOUNTER — Encounter: Payer: Self-pay | Admitting: Family

## 2021-11-12 DIAGNOSIS — F840 Autistic disorder: Secondary | ICD-10-CM

## 2021-11-12 DIAGNOSIS — Z7189 Other specified counseling: Secondary | ICD-10-CM

## 2021-11-12 DIAGNOSIS — R278 Other lack of coordination: Secondary | ICD-10-CM | POA: Diagnosis not present

## 2021-11-12 DIAGNOSIS — F900 Attention-deficit hyperactivity disorder, predominantly inattentive type: Secondary | ICD-10-CM

## 2021-11-12 DIAGNOSIS — F332 Major depressive disorder, recurrent severe without psychotic features: Secondary | ICD-10-CM

## 2021-11-12 DIAGNOSIS — Z719 Counseling, unspecified: Secondary | ICD-10-CM

## 2021-11-12 DIAGNOSIS — F411 Generalized anxiety disorder: Secondary | ICD-10-CM | POA: Diagnosis not present

## 2021-11-12 DIAGNOSIS — Z79899 Other long term (current) drug therapy: Secondary | ICD-10-CM

## 2021-11-12 MED ORDER — FLUOXETINE HCL 20 MG PO TABS: 30.0000 mg | ORAL_TABLET | Freq: Every day | ORAL | 2 refills | Status: DC

## 2021-11-12 NOTE — Progress Notes (Signed)
[place] [facility] PSYCHOLOGICAL C[facility] [address], [place]. [address] [place] [address] Dept: [phone] Dept Fax: [phone]  Medication Check visit via Virtual Video   Patient ID:  Linda Franco  female DOB: [birth year]   20 y.o.   MRN: [id number]   DATE:11/12/21  PCP: Gordon Blas, MD  Virtual Visit via Video Note  I connected with  Linda Franco  on 11/12/21 at  2:00 PM EST by a video enabled telemedicine application and verified that I am speaking with the correct person using two identifiers. Patient/Parent Location: at home   I discussed the limitations, risks, security and privacy concerns of performing an evaluation and management service by telephone and the availability of in person appointments. I also discussed with the parents that there may be a patient responsible charge related to this service. The parents expressed understanding and agreed to proceed.  Provider: Carron Curie, NP  Location: private work location  HPI/CURRENT STATUS: Linda Franco is here for medication management of the psychoactive medications for ADHD and review of educational and behavioral concerns.   Linda Franco currently taking Prozac 20 mg at night, which is working well. Takes medication at 7:00 pm. Medication tends to last until the next day and no side effects. Crystie is able to focus through school/homework.   Linda Franco is eating well (eating breakfast, lunch and dinner). Linda Franco does not have appetite suppression  Sleeping well (goes to bed at 10-12:00 am wakes at 7-9:00 am), sleeping through the night. Linda Franco does not have delayed sleep onset  EDUCATION: School: [employer] Year/Grade:  college   Performance/ Grades: above average Services: Other: none reported Not work at this time  Activities/ Exercise:  not involved with school activities.due to schedule  MEDICAL HISTORY: Individual Medical History/ Review  of Systems: None  Has been healthy with no visits to the PCP. WCC due yearly.   Family Medical/ Social History: Changes? None Patient Lives with: parents  MENTAL HEALTH: Mental Health Issues:   Depression and Anxiety-symptom control that is reported as adequate with Prozac 20 mg daily. No suicidal thoughts or ideations.   Allergies: No Known Allergies  Current Medications:  Current Outpatient Medications  Medication Instructions   FLUoxetine (PROZAC) 30 mg, Oral, Daily, For dose titration.   Medication Side Effects: None  DIAGNOSES:    ICD-10-CM   1. ADHD (attention deficit hyperactivity disorder), inattentive type  F90.0     2. Generalized anxiety disorder  F41.1     3. Severe episode of recurrent major depressive disorder, without psychotic features (HCC)  F33.2     4. Dysgraphia  R27.8     5. Autism spectrum disorder  F84.0     6. Medication management  Z79.899     7. Patient counseled  Z71.9     8. Goals of care, counseling/discussion  Z71.89      ASSESSMENT:   Linda Franco is a 20 year old individual with a history of ADHD, ASD, Anxiety and Depression that has adequate control of her symptoms for the majority of the day. Linda Franco states that her anxiety and depression are mostly controlled but could be better. Not currently enrolled in counseling and may consider restarting after the fall semester is over. Currently enrolled at [employer] full time with good academic standings with her classes. Linda Franco is currently not getting any formal services to assist with her learning or attention. NO recent changes reported in eating, sleeping or health since the last visit.  Adjustment to her Prozac for better symptom control will be discussed.  PLAN/RECOMMENDATIONS:  Updates for school, academics, progress, health and medical changes.  Academically doing well and will enroll for the spring semester at [employer].  No formal academic services are in place and Linda Franco can get extra tutoring if  needed.  Not currently in counseling and discussed this with executive function and emotional dysregulation. May consider after the fall semester to seek a new counselor.   Anxiety and depression with some continued symptoms. Discussed the need for an adjustment with her Prozac and patient agreed.   Patient encouraged to have good eating and sleeping habits to assist with overall mental & physical health.  Discussed work and job seeking with limited success over the past few months, but still encouraged to keep trying.   Counseled medication pharmacokinetics, options, dosage, administration, desired effects, and possible side effects.   Prozac 20 mg daily, no Rx today   I discussed the assessment and treatment plan with the patient & parent. The patient & parent was provided an opportunity to ask questions and all were answered. The patient/ parent agreed with the plan and demonstrated an understanding of the instructions.   NEXT APPOINTMENT:  02/25/2022-for routine care Telehealth OK  The patient & parent was advised to call back or seek an in-person evaluation if the symptoms worsen or if the condition fails to improve as anticipated.   Carron Curie, NP

## 2021-11-13 ENCOUNTER — Encounter: Payer: Self-pay | Admitting: Family

## 2022-02-10 ENCOUNTER — Telehealth (INDEPENDENT_AMBULATORY_CARE_PROVIDER_SITE_OTHER): Payer: 59 | Admitting: Family

## 2022-02-10 ENCOUNTER — Other Ambulatory Visit: Payer: Self-pay

## 2022-02-10 ENCOUNTER — Encounter: Payer: Self-pay | Admitting: Family

## 2022-02-10 DIAGNOSIS — Z79899 Other long term (current) drug therapy: Secondary | ICD-10-CM | POA: Diagnosis not present

## 2022-02-10 DIAGNOSIS — F900 Attention-deficit hyperactivity disorder, predominantly inattentive type: Secondary | ICD-10-CM | POA: Diagnosis not present

## 2022-02-10 DIAGNOSIS — F411 Generalized anxiety disorder: Secondary | ICD-10-CM | POA: Diagnosis not present

## 2022-02-10 DIAGNOSIS — Z8659 Personal history of other mental and behavioral disorders: Secondary | ICD-10-CM | POA: Diagnosis not present

## 2022-02-10 DIAGNOSIS — Z7189 Other specified counseling: Secondary | ICD-10-CM

## 2022-02-10 DIAGNOSIS — R4689 Other symptoms and signs involving appearance and behavior: Secondary | ICD-10-CM

## 2022-02-10 DIAGNOSIS — Z719 Counseling, unspecified: Secondary | ICD-10-CM

## 2022-02-10 MED ORDER — BUPROPION HCL 75 MG PO TABS: 75.0000 mg | ORAL_TABLET | Freq: Two times a day (BID) | ORAL | 2 refills | Status: DC

## 2022-02-10 NOTE — Progress Notes (Signed)
?[facility] ?[facility] [place] ?[address] [place] ?[place] [address] ?Dept: [phone] ?Dept Fax: [phone] ? ?Medication Check visit via Virtual Video  ? ?Patient ID:  Linda Franco  female DOB: 12/12/2000   21 y.o.   MRN: [id number]  ? ?DATE:02/10/22 ? ?PCP: Malva Cogan, MD ? ?Virtual Visit via Video Note ? ?I connected with  Linda Franco  and Linda Franco 's Mother (Name Linda Franco) on 02/10/22 at  9:00 AM EST by a video enabled telemedicine application and verified that I am speaking with the correct person using two identifiers. Patient/Parent Location: at home ?  ?I discussed the limitations, risks, security and privacy concerns of performing an evaluation and management service by telephone and the availability of in person appointments. I also discussed with the parents that there may be a patient responsible charge related to this service. The parents expressed understanding and agreed to proceed. ? ?Provider: Carron Curie, NP  Location: work location ? ?HPI/CURRENT STATUS: ?Linda Franco is here for medication management of the psychoactive medications for ADHD and review of educational and behavioral concerns.  ? ?Linda Franco currently taking not taking any medication,  which is NOT working well. Linda Franco is able to focus through homework.  ? ?Linda Franco is eating well (eating breakfast, lunch and dinner). Linda Franco does not have appetite suppression, but does have stomach aches on occasion.  ? ?Sleeping well (No current problems with sleeping), sleeping through the night. Linda Franco does not have delayed sleep onset ? 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 ?Activities/ Exercise:  Not reported ? ?MEDICAL HISTORY: ?Individual  Medical History/ Review of Systems: None reported  Has been healthy with no visits to the PCP. WCC due yearly or as needed.  ? ?Family Medical/ Social History: Changes? None ?Patient Lives with: parents and sister ? ?MENTAL HEALTH: ?Mental Health Issues:   Depression and Anxiety-Not well controlled without medications.    ? ?Allergies: ?No Known Allergies ? ?Current Medications:  ?Current Outpatient Medications  ?Medication Instructions  ? buPROPion (WELLBUTRIN) 75 mg, Oral, 2 times daily  ? ?Medication Side Effects: None ? ?DIAGNOSES:  ?  ICD-10-CM   ?1. ADHD (attention deficit hyperactivity disorder), inattentive type  F90.0   ?  ?2. Generalized anxiety disorder  F41.1   ?  ?3. History of depression  Z86.59   ?  ?4. Medication management  Z79.899   ?  ?5. 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  ?6. Patient counseled  Z71.9   ?  ?7. Goals of care, counseling/discussion  Z71.89   ?  ? ?ASSESSMENT:    ?Linda Franco is now requesting to be referred to [facility] due to not wanting to identify with a specific gender. Recently has started working at [employer] and still attending [employer] with no academic issues reported. NO formal services are in place for learning support. Not currently taking any medications due to stopping the Prozac about 3 months ago. Still having issues with anxiety and depression along with emotional dysregulation. No counseling in place. Eating has not changed but still having stomach issues that are not related to medications. Sleeping with no concerns. Discussion of treatment options for medication related to current symptom management along with counseling recommendations.  ? ?PLAN/RECOMMENDATIONS:  ?Updates for school, academic, progress and current classes at [employer]. ? ?No  formal services in place at school, but can get help if needed with learning support.  ? ?Current work and school along with family life balance discussed.  ? ?Discussed continued issues with body image and ongoing concerns with  perseveration. Reviewed at length with patient and parent at the visit. ? ?Counseling recommended with suggestion of reaching out to the several therapy groups in the area. [facility] may be considered due to various support for current difficulties.   ? ?Vocational Rehab and [facility] were suggested for follow up with working support along with support groups with adult ASD. ? ?Encouraged to f/u with GI related to ongoing issues with stomach aches that are not medication related.  ? ?Sleep habits discussed and no current concerns reported.  ? ?Medication options discussed with current pharmacogenetic testing results for treatment.  ? ?Counseled medication pharmacokinetics, options, dosage, administration, desired effects, and possible side effects.   ?Discontinuation of the Prozac ?Start Wellbutrin 75 mg daily, # 30 with 2 RF's ?RX for above e-scribed and sent to pharmacy on record ? ?[id number] [place], [place] - [address] ?[address] ?[place] [address] ?Phone: [phone] Fax: [phone] ? ?Did discuss use of Strattera if needed to treat the ADHD.  ? ?I discussed the assessment and treatment plan with the patient & parent. The patient & parent was provided an opportunity to ask questions and all were answered. The patient & parent agreed with the plan and demonstrated an understanding of the instructions. ?  ?NEXT APPOINTMENT:  ?05/26/2022-f/u visit ?Telehealth OK ? ?The patient & parent was advised to call back or seek an in-person evaluation if the symptoms worsen or if the condition fails to improve as anticipated. ? ? ?Carron Curie, NP ? ?

## 2022-02-13 ENCOUNTER — Encounter: Payer: Self-pay | Admitting: Family

## 2022-02-25 ENCOUNTER — Telehealth: Payer: 59 | Admitting: Family

## 2022-04-15 ENCOUNTER — Encounter: Payer: Self-pay | Admitting: Family Medicine

## 2022-04-15 ENCOUNTER — Ambulatory Visit (INDEPENDENT_AMBULATORY_CARE_PROVIDER_SITE_OTHER): Payer: 59 | Admitting: Family Medicine

## 2022-04-15 VITALS — BP 90/60 | HR 75 | Temp 97.7°F | Ht 67.02 in | Wt 113.0 lb

## 2022-04-15 DIAGNOSIS — F411 Generalized anxiety disorder: Secondary | ICD-10-CM | POA: Diagnosis not present

## 2022-04-15 DIAGNOSIS — R5383 Other fatigue: Secondary | ICD-10-CM | POA: Diagnosis not present

## 2022-04-15 DIAGNOSIS — F339 Major depressive disorder, recurrent, unspecified: Secondary | ICD-10-CM | POA: Diagnosis not present

## 2022-04-15 DIAGNOSIS — G4700 Insomnia, unspecified: Secondary | ICD-10-CM | POA: Diagnosis not present

## 2022-04-15 LAB — CBC WITH DIFFERENTIAL/PLATELET
Basophils Absolute: 0 10*3/uL (ref 0.0–0.1)
Basophils Relative: 0.7 % (ref 0.0–3.0)
Eosinophils Absolute: 0.1 10*3/uL (ref 0.0–0.7)
Eosinophils Relative: 2 % (ref 0.0–5.0)
HCT: 40.4 % (ref 36.0–49.0)
Hemoglobin: 14 g/dL (ref 12.0–16.0)
Lymphocytes Relative: 35.4 % (ref 24.0–48.0)
Lymphs Abs: 1.5 10*3/uL (ref 0.7–4.0)
MCHC: 34.7 g/dL (ref 31.0–37.0)
MCV: 93.3 fl (ref 78.0–98.0)
Monocytes Absolute: 0.4 10*3/uL (ref 0.1–1.0)
Monocytes Relative: 10 % (ref 3.0–12.0)
Neutro Abs: 2.2 10*3/uL (ref 1.4–7.7)
Neutrophils Relative %: 51.9 % (ref 43.0–71.0)
Platelets: 172 10*3/uL (ref 150.0–575.0)
RBC: 4.33 Mil/uL (ref 3.80–5.70)
RDW: 12.7 % (ref 11.4–15.5)
WBC: 4.2 10*3/uL — ABNORMAL LOW (ref 4.5–13.5)

## 2022-04-15 LAB — COMPREHENSIVE METABOLIC PANEL
ALT: 12 U/L (ref 0–35)
AST: 13 U/L (ref 0–37)
Albumin: 4.3 g/dL (ref 3.5–5.2)
Alkaline Phosphatase: 57 U/L (ref 47–119)
BUN: 14 mg/dL (ref 6–23)
CO2: 27 mEq/L (ref 19–32)
Calcium: 9 mg/dL (ref 8.4–10.5)
Chloride: 102 mEq/L (ref 96–112)
Creatinine, Ser: 0.75 mg/dL (ref 0.40–1.20)
GFR: 115.31 mL/min (ref >60.00)
Glucose, Bld: 73 mg/dL (ref 70–99)
Potassium: 3.5 mEq/L (ref 3.5–5.1)
Sodium: 139 mEq/L (ref 135–145)
Total Bilirubin: 0.8 mg/dL (ref 0.2–1.2)
Total Protein: 7.2 g/dL (ref 6.0–8.3)

## 2022-04-15 LAB — T4, FREE: Free T4: 1.03 ng/dL (ref 0.60–1.60)

## 2022-04-15 LAB — TSH: TSH: 1.06 u[IU]/mL (ref 0.40–5.00)

## 2022-04-15 LAB — VITAMIN D 25 HYDROXY (VIT D DEFICIENCY, FRACTURES): VITD: 15.03 ng/mL — ABNORMAL LOW (ref 30.00–100.00)

## 2022-04-15 LAB — VITAMIN B12: Vitamin B-12: 431 pg/mL (ref 211–911)

## 2022-04-15 NOTE — Patient Instructions (Signed)
Thank you for trusting [place] with your health care. ? ?Please go to the first floor for your labs before leaving. ? ?We will be in touch with your results. ?

## 2022-04-15 NOTE — Progress Notes (Signed)
? ?New Patient Office Visit ? ?Subjective   ? ?Patient ID: Linda Franco, female    DOB: Dec 24, 2000  Age: 21 y.o. MRN: [id number] ? ?CC:  ?Chief Complaint  ?Patient presents with  ? Establish Care  ?  No concerns  ? ? ?HPI ?Linda Franco presents to establish care.  ?Previous medical care:  [facility].  ? ?States she is taking Wellbutrin for depression. She sees a Warden/ranger.  ?Denies suicidal thoughts.  ? ?Complains of fatigue x 1 week  ?Reports trouble sleeping x 2 months.  ? ?Shortness of breath x 2 months.  ? ?She has a job and is working on an Scientist, research (physical sciences).  ? ?Denies alcohol or drug use. No smoking  ? ?LMP: currently on her cycle ?Regular menstrual cycles ? ? ? ? ?  04/15/2022  ? 10:57 AM 01/30/2021  ?  2:58 AM  ?Depression screen PHQ 2/9  ?Decreased Interest 1 2  ?Down, Depressed, Hopeless 3 3  ?PHQ - 2 Score 4 5  ?Altered sleeping 3 2  ?Tired, decreased energy 1 2  ?Change in appetite 2 2  ?Feeling bad or failure about yourself  3 3  ?Trouble concentrating 2 2  ?Moving slowly or fidgety/restless 0 0  ?Suicidal thoughts 1 1  ?PHQ-9 Score 16 17  ?Difficult doing work/chores Somewhat difficult Somewhat difficult  ? ? ? ?Outpatient Encounter Medications as of 04/15/2022  ?Medication Sig  ? buPROPion (WELLBUTRIN) 75 MG tablet Take 1 tablet (75 mg total) by mouth 2 (two) times daily.  ? ?No facility-administered encounter medications on file as of 04/15/2022.  ? ? ?History reviewed. No pertinent past medical history. ? ?History reviewed. No pertinent surgical history. ? ?Family History  ?Problem Relation Age of Onset  ? High blood pressure Mother   ? Miscarriages / [place] Mother   ? Alcohol abuse Father   ? Drug abuse Father   ? Asthma Sister   ? Cancer Maternal Grandmother   ? Diabetes Maternal Grandfather   ? High blood pressure Maternal Grandfather   ? Arthritis Paternal Grandmother   ? High blood pressure Paternal Grandmother   ? Cancer Paternal Grandfather   ? Heart attack  Paternal Grandfather   ? High Cholesterol Paternal Grandfather   ? High blood pressure Paternal Grandfather   ? ? ?Social History  ? ?Socioeconomic History  ? Marital status: Single  ?  Spouse name: Not on file  ? Number of children: Not on file  ? Years of education: Not on file  ? Highest education level: Not on file  ?Occupational History  ? Not on file  ?Tobacco Use  ? Smoking status: Never  ? Smokeless tobacco: Never  ?Substance and Sexual Activity  ? Alcohol use: Never  ? Drug use: Not on file  ? Sexual activity: Not on file  ?Other Topics Concern  ? Not on file  ?Social History Narrative  ? Not on file  ? ?Social Determinants of Health  ? ?Financial Resource Strain: Not on file  ?Food Insecurity: Not on file  ?Transportation Needs: Not on file  ?Physical Activity: Not on file  ?Stress: Not on file  ?Social Connections: Not on file  ?Intimate Partner Violence: Not on file  ? ? ?ROS ?Pertinent positives and negatives in the history of present illness. ? ?  ? ? ?Objective   ? ?BP 90/60 (BP Location: Left Arm, Patient Position: Sitting, Cuff Size: Large)   Pulse 75   Temp 97.7 ?F (36.5 ?C) (  Temporal)   Ht 5' 7.02" (1.702 m)   Wt 113 lb (51.3 kg)   LMP 04/15/2022   SpO2 98%   BMI 17.69 kg/m?  ? ?Physical Exam ?Constitutional:   ?   General: She is not in acute distress. ?   Appearance: She is not ill-appearing.  ?Cardiovascular:  ?   Rate and Rhythm: Normal rate.  ?Pulmonary:  ?   Effort: Pulmonary effort is normal.  ?Neurological:  ?   General: No focal deficit present.  ?   Mental Status: She is alert and oriented to person, place, and time.  ?Psychiatric:     ?   Mood and Affect: Mood normal.     ?   Behavior: Behavior normal.  ? ? ? ?  ? ?Assessment & Plan:  ? ?Problem List Items Addressed This Visit   ? ?  ? Other  ? Anxiety disorder  ? Major depression  ? ?Other Visit Diagnoses   ? ? Fatigue, unspecified type    -  Primary  ? Relevant Orders  ? CBC with Differential/Platelet (Completed)  ?  Comprehensive metabolic panel (Completed)  ? TSH (Completed)  ? T4, free (Completed)  ? Vitamin B12 (Completed)  ? VITAMIN D 25 Hydroxy (Vit-D Deficiency, Fractures) (Completed)  ? Iron, TIBC and Ferritin Panel (Completed)  ? Insomnia, unspecified type      ? Relevant Orders  ? TSH (Completed)  ? T4, free (Completed)  ? ?  ? ?Here to establish care.  Complains of fatigue and insomnia.  History of depression and sees psychologist.  Denies SI.  She is on Wellbutrin.  Check labs and follow-up ? ?Return for pending labs.  ? ?Hetty Blend, NP-C ? ? ?

## 2022-04-16 LAB — IRON,TIBC AND FERRITIN PANEL
%SAT: 42 % (calc) (ref 15–45)
Ferritin: 11 ng/mL — ABNORMAL LOW (ref 16–154)
Iron: 148 ug/dL (ref 27–164)
TIBC: 354 mcg/dL (calc) (ref 271–448)

## 2022-04-17 ENCOUNTER — Encounter: Payer: Self-pay | Admitting: Family Medicine

## 2022-04-18 NOTE — Progress Notes (Signed)
Her labs show a low vitamin D level and I will send in a prescription vitamin D for her to take ONCE WEEKLY for the next 12 weeks. She can then take over the counter vitamin D3 1,000 IUs daily when she is done with the prescription. Her labs are fine otherwise. No anemia and normal thyroid, kidney and liver function.

## 2022-04-20 ENCOUNTER — Telehealth: Payer: Self-pay

## 2022-04-20 ENCOUNTER — Other Ambulatory Visit: Payer: Self-pay | Admitting: Family Medicine

## 2022-04-20 DIAGNOSIS — E559 Vitamin D deficiency, unspecified: Secondary | ICD-10-CM

## 2022-04-20 MED ORDER — VITAMIN D (ERGOCALCIFEROL) 1.25 MG (50000 UNIT) PO CAPS: 50000.0000 [IU] | ORAL_CAPSULE | ORAL | 0 refills | Status: DC

## 2022-04-20 NOTE — Telephone Encounter (Signed)
Pt called back to get results. I relayed results and pt was ok with you sending in Vitamin D prescription. [facility] [address] ?

## 2022-05-26 ENCOUNTER — Encounter: Payer: Self-pay | Admitting: Family

## 2022-05-26 ENCOUNTER — Telehealth (INDEPENDENT_AMBULATORY_CARE_PROVIDER_SITE_OTHER): Payer: 59 | Admitting: Family

## 2022-05-26 DIAGNOSIS — Z79899 Other long term (current) drug therapy: Secondary | ICD-10-CM

## 2022-05-26 DIAGNOSIS — F411 Generalized anxiety disorder: Secondary | ICD-10-CM | POA: Diagnosis not present

## 2022-05-26 DIAGNOSIS — F332 Major depressive disorder, recurrent severe without psychotic features: Secondary | ICD-10-CM | POA: Diagnosis not present

## 2022-05-26 DIAGNOSIS — F649 Gender identity disorder, unspecified: Secondary | ICD-10-CM

## 2022-05-26 DIAGNOSIS — F900 Attention-deficit hyperactivity disorder, predominantly inattentive type: Secondary | ICD-10-CM | POA: Diagnosis not present

## 2022-05-26 DIAGNOSIS — Z719 Counseling, unspecified: Secondary | ICD-10-CM

## 2022-05-26 DIAGNOSIS — Z7189 Other specified counseling: Secondary | ICD-10-CM

## 2022-05-26 NOTE — Progress Notes (Signed)
[facility] [address] [place] [address] Dept: [phone] Dept Fax: [phone]  Medication Check visit via Virtual Video   Patient ID:  Linda Franco  female DOB: Jun 06, 2001   20 y.o.   MRN: [id number]   DATE:05/26/22  PCP: Avanell Shackleton, NP-C  Virtual Visit via Video Note  I connected with  Linda Franco on 05/26/22 at  2:00 PM EDT by a video enabled telemedicine application and verified that I am speaking with the correct person using two identifiers. Patient/Parent Location: in [place] at family's house  I discussed the limitations, risks, security and privacy concerns of performing an evaluation and management service by telephone and the availability of in person appointments. I also discussed with the parents that there may be a patient responsible charge related to this service. The parents expressed understanding and agreed to proceed.  Provider: Carron Curie, NP  Location: work location  HPI/CURRENT STATUS: Linda Franco is here for medication management of the psychoactive medications for ADHD and review of educational and behavioral concerns.   [name] currently taking Wellbutrin 75 mg daily, which is working well. Takes medication daily in the morning. Medication tends to last until the next dose. [name] is able to focus through school & homework.   [name] is eating well (eating breakfast, lunch and dinner). [name] does not have appetite suppression and getting stomach aches with sugar and dairy.   Sleeping well (goes to bed at 12:00 am wakes at 10:00 am), sleeping through the night. 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   MEDICAL HISTORY: Individual Medical History/ Review of Systems: Yes, Vitamin D with increased fatigue since starting vitamins.  Has been healthy with no visits to the PCP. WCC due yearly.   Family Medical/ Social History:  Patient Lives with: parents and sibling  MENTAL HEALTH: Mental Health Issues: Depression and Anxiety-better recently with Wellbutrin. No counseling at this time.   Allergies: No Known Allergies  Current Medications:  Current Outpatient Medications on File Prior to Visit  Medication Sig Dispense Refill   buPROPion (WELLBUTRIN) 75 MG tablet Take 1 tablet (75 mg total) by mouth 2 (two) times daily. 30 tablet 2   tretinoin (RETIN-A) 0.025 % cream 1 application in the evening to face Externally Once a day for 30 days     Vitamin D, Ergocalciferol, (DRISDOL) 1.25 MG (50000 UNIT) CAPS capsule Take 1 capsule (50,000 Units total) by mouth every 7 (seven) days. 12 capsule 0   No current facility-administered medications on file prior to visit.   Medication Side Effects: None  DIAGNOSES:    ICD-10-CM   1. ADHD (attention deficit hyperactivity disorder), inattentive type  F90.0     2. Generalized anxiety disorder  F41.1     3. Severe episode of recurrent major depressive disorder, without psychotic features (HCC)  F33.2     4. Gender dysphoria  F64.9     5. Medication management  Z79.899     6. Patient counseled  Z71.9     7. Goals of care, counseling/discussion  Z71.89      ASSESSMENT:    "[name]" is a 21 year old individual with a history of ADHD, Anxiety, Depression and gender dysphoria.  [name] has done well with current medication of Wellbutrin 75 mg daily for symptom control with no side effects. Still working at [employer] 5 days/week and taking classes at [employer] No recent academic issues and no formal services in place. No recent issues with eating, sleeping or health in the past few months. Continued with body dysmorphia  and not seeking counseling at this time. Medications to continue with current dose of Wellbutrin and recommend counseling with [facility].   PLAN/RECOMMENDATIONS:  Patient provided updates for school, work, family and health since the last visit on 02/10/2022.  Has continued academic success with no formal services in place at school.  School and work along with life balance discussed with patient.  Body image and continued concerns discussed with patient.   No recent issues with GI and stomach aches with no medical interventions.   [facility] at [facility] for [facility] dysphoria evaluation.  Suggested filing out form online for [employer].  Sleep hygiene and current sleep schedule discussed with patient.   Medication management and symptoms discussed with patient with current medication dose.  Counseled medication pharmacokinetics, options, dosage, administration, desired effects, and possible side effects.   Wellbutrin 75 mg only taking 1 time daily, no Rx today   I discussed the assessment and treatment plan with the patient. The patient was provided an opportunity to ask questions and all were answered. The patient agreed with the plan and demonstrated an understanding of the instructions.   NEXT APPOINTMENT:  09/01/2022-routine f/u visit  Telehealth OK  The patient was advised to call back or seek an in-person evaluation if the symptoms worsen or if the condition fails to improve as anticipated.  Carron Curie, NP

## 2022-05-31 ENCOUNTER — Encounter: Payer: Self-pay | Admitting: Family

## 2022-06-01 ENCOUNTER — Ambulatory Visit (INDEPENDENT_AMBULATORY_CARE_PROVIDER_SITE_OTHER): Payer: 59 | Admitting: Family Medicine

## 2022-06-01 ENCOUNTER — Encounter: Payer: Self-pay | Admitting: Family Medicine

## 2022-06-01 VITALS — BP 108/70 | HR 67 | Temp 98.5°F | Ht 67.5 in | Wt 111.0 lb

## 2022-06-01 DIAGNOSIS — B349 Viral infection, unspecified: Secondary | ICD-10-CM | POA: Diagnosis not present

## 2022-06-01 DIAGNOSIS — R432 Parageusia: Secondary | ICD-10-CM

## 2022-06-01 DIAGNOSIS — J029 Acute pharyngitis, unspecified: Secondary | ICD-10-CM | POA: Diagnosis not present

## 2022-06-01 DIAGNOSIS — R430 Anosmia: Secondary | ICD-10-CM | POA: Diagnosis not present

## 2022-06-01 DIAGNOSIS — R509 Fever, unspecified: Secondary | ICD-10-CM

## 2022-06-01 LAB — POCT RAPID STREP A (OFFICE): Rapid Strep A Screen: NEGATIVE

## 2022-06-01 LAB — POC COVID19 BINAXNOW: SARS Coronavirus 2 Ag: NEGATIVE

## 2022-06-01 NOTE — Patient Instructions (Addendum)
You appear to have a viral illness. Your strep test is negative.   Your Covid test is negative today but I recommend checking another one at home or come back in here if you need to in 1-2 days.   Take ibuprofen 800 mg three times daily with food and a glass of water.  Do salt water gargles (1/2 teaspoon of salt in a glass of warm water).   I also recommend Chloraseptic spray, lozenges or drops.   You can continue the over the counter multi-symptom cold medication.   Follow up via mychart if you are getting worse or not improving by Friday.   Sore Throat A sore throat is pain, burning, irritation, or scratchiness in the throat. When you have a sore throat, you may feel pain or tenderness in your throat when you swallow or talk. Many things can cause a sore throat, including: An infection. Seasonal allergies. Dryness in the air. Irritants, such as smoke or pollution. Radiation treatment for cancer. Gastroesophageal reflux disease (GERD). A tumor. A sore throat is often the first sign of another sickness. It may happen with other symptoms, such as coughing, sneezing, fever, and swollen neck glands. Most sore throats go away without medical treatment. Follow these instructions at home:     Medicines Take over-the-counter and prescription medicines only as told by your health care provider. Children often get sore throats. Do not give your child aspirin because of the association with Reye's syndrome. Use throat sprays to soothe your throat as told by your health care provider. Managing pain To help with pain, try: Sipping warm liquids, such as broth, herbal tea, or warm water. Eating or drinking cold or frozen liquids, such as frozen ice pops. Gargling with a mixture of salt and water 3-4 times a day or as needed. To make salt water, completely dissolve -1 tsp (3-6 g) of salt in 1 cup (237 mL) of warm water. Sucking on hard candy or throat lozenges. Putting a cool-mist humidifier  in your bedroom at night to moisten the air. Sitting in the bathroom with the door closed for 5-10 minutes while you run hot water in the shower. General instructions Do not use any products that contain nicotine or tobacco. These products include cigarettes, chewing tobacco, and vaping devices, such as e-cigarettes. If you need help quitting, ask your health care provider. Rest as needed. Drink enough fluid to keep your urine pale yellow. Wash your hands often with soap and water for at least 20 seconds. If soap and water are not available, use hand sanitizer. Contact a health care provider if: You have a fever for more than 2-3 days. You have symptoms that last for more than 2-3 days. Your throat does not get better within 7 days. You have a fever and your symptoms suddenly get worse. Get help right away if: You have difficulty breathing. You cannot swallow fluids, soft foods, or your saliva. You have increased swelling in your throat or neck. You have persistent nausea and vomiting. These symptoms may represent a serious problem that is an emergency. Do not wait to see if the symptoms will go away. Get medical help right away. Call your local emergency services (911 in the [place] Do not drive yourself to the hospital. Summary A sore throat is pain, burning, irritation, or scratchiness in the throat. Many things can cause a sore throat. Take over-the-counter medicines only as told by your health care provider. Rest as needed. Drink enough fluid to keep your  urine pale yellow. Contact a health care provider if your throat does not get better within 7 days. This information is not intended to replace advice given to you by your health care provider. Make sure you discuss any questions you have with your health care provider. Document Revised: 02/17/2021 Document Reviewed: 02/17/2021 Elsevier Patient Education  2023 [employer].

## 2022-06-01 NOTE — Progress Notes (Signed)
Subjective:  Linda Franco is a 21 y.o. female who presents for a 3 day history of fever, chills, body aches, headache, nasal congestion, severe sore throat, post nasal drainage, mild cough.   Denies dizziness, chest pain, palpitations, shortness of breath, abdominal pain, N/V/D.   States she lost sense of taste and smell yesterday.   LMP: 04/2022  Denies ever having Covid and no recent strep throat.   No Covid vaccines.   Treatment to date:  ibuprofen and cold medication .  Denies sick contacts.  No other aggravating or relieving factors.  No other c/o.  ROS as in subjective.  History reviewed. No pertinent past medical history. History reviewed. No pertinent surgical history.    Objective: Vitals:   06/01/22 1132  BP: 108/70  Pulse: 67  Temp: 98.5 F (36.9 C)  SpO2: 99%    General appearance: Alert, WD/WN, no distress, mildly ill appearing                             Skin: warm, no rash                           Head: no sinus tenderness                            Eyes: conjunctiva normal, corneas clear, PERRLA                            Ears: pearly TMs, external ear canals normal                          Nose: septum midline, turbinates swollen, with erythema and clear discharge             Mouth/throat: MMM, tongue normal, mild pharyngeal erythema                           Neck: supple, no adenopathy, no thyromegaly, nontender                          Heart: RRR                         Lungs: CTA bilaterally, no wheezes, rales, or rhonchi      Assessment: Systemic viral illness  Sore throat - Plan: POC COVID-19, POCT rapid strep A  Fever, unspecified fever cause - Plan: POC COVID-19, POCT rapid strep A  Loss of taste - Plan: POC COVID-19  Loss of smell - Plan: POC COVID-19  Acute pharyngitis, unspecified etiology   Plan: Negative rapid strep and Covid tests.  Discussed diagnosis and treatment of viral URI.  Suggested symptomatic OTC  remedies. Nasal saline spray for congestion.  Tylenol or Ibuprofen OTC for fever and malaise.  Call/return in 2-3 days if symptoms aren't resolving.

## 2022-06-03 ENCOUNTER — Telehealth: Payer: Self-pay | Admitting: Family Medicine

## 2022-06-03 NOTE — Telephone Encounter (Signed)
Pt is f/u on visit and states she doesn't feel any better

## 2022-06-03 NOTE — Telephone Encounter (Signed)
Pt stated not any better today. Pt stated Vickie request f/u call on today if pt.was not feeling any better today.   Please Advise

## 2022-06-06 NOTE — Telephone Encounter (Signed)
LM for patient to return my call.

## 2022-06-08 ENCOUNTER — Other Ambulatory Visit: Payer: Self-pay | Admitting: Family Medicine

## 2022-06-08 MED ORDER — AMOXICILLIN-POT CLAVULANATE 875-125 MG PO TABS: 1.0000 | ORAL_TABLET | Freq: Two times a day (BID) | ORAL | 0 refills | Status: DC

## 2022-06-08 NOTE — Telephone Encounter (Signed)
Called and let her know prescription has been sent to pharmacy for pick up

## 2022-06-08 NOTE — Telephone Encounter (Signed)
Sore throat has gotten worse and it is now hard for pt to swallow. Pt now has a productive cough as well.

## 2022-06-08 NOTE — Telephone Encounter (Signed)
Pt mother [name] called in. She stated the pt sore throat has gotten worse. It is hard for pt to swallow. Pt now has a cough accompanied by mucus.    Please advise  CB: [phone] - [name]

## 2022-08-25 ENCOUNTER — Other Ambulatory Visit: Payer: Self-pay

## 2022-08-26 MED ORDER — BUPROPION HCL 75 MG PO TABS: 75.0000 mg | ORAL_TABLET | Freq: Two times a day (BID) | ORAL | 2 refills | Status: DC

## 2022-08-26 NOTE — Telephone Encounter (Signed)
Wellbutrin 75 mg BID, # 60 with 2 RF's.RX for above e-scribed and sent to pharmacy on record  [id number] [place], [place] [address] [place] [address] Phone: [phone] Fax: [phone]

## 2022-09-01 ENCOUNTER — Institutional Professional Consult (permissible substitution): Payer: 59 | Admitting: Family

## 2022-10-18 ENCOUNTER — Other Ambulatory Visit: Payer: Self-pay | Admitting: Family

## 2022-10-18 NOTE — Telephone Encounter (Signed)
Wellbutrin 75 mg BID, # 180 with 1 RF"s.RX for above e-scribed and sent to pharmacy on record  [id number] [place], [place] - [address] [place] [address] Phone: [phone] Fax: [phone]

## 2022-12-14 ENCOUNTER — Institutional Professional Consult (permissible substitution): Payer: 59 | Admitting: Family

## 2023-01-05 ENCOUNTER — Ambulatory Visit (INDEPENDENT_AMBULATORY_CARE_PROVIDER_SITE_OTHER): Payer: 59 | Admitting: Family Medicine

## 2023-01-05 ENCOUNTER — Encounter: Payer: Self-pay | Admitting: Family Medicine

## 2023-01-05 VITALS — BP 94/60 | HR 60 | Temp 97.7°F | Ht 67.5 in | Wt 118.0 lb

## 2023-01-05 DIAGNOSIS — R233 Spontaneous ecchymoses: Secondary | ICD-10-CM | POA: Diagnosis not present

## 2023-01-05 DIAGNOSIS — R790 Abnormal level of blood mineral: Secondary | ICD-10-CM

## 2023-01-05 DIAGNOSIS — R5383 Other fatigue: Secondary | ICD-10-CM | POA: Diagnosis not present

## 2023-01-05 DIAGNOSIS — R6889 Other general symptoms and signs: Secondary | ICD-10-CM | POA: Diagnosis not present

## 2023-01-05 DIAGNOSIS — E559 Vitamin D deficiency, unspecified: Secondary | ICD-10-CM | POA: Diagnosis not present

## 2023-01-05 LAB — CBC WITH DIFFERENTIAL/PLATELET
Basophils Absolute: 0 10*3/uL (ref 0.0–0.1)
Basophils Relative: 0.7 % (ref 0.0–3.0)
Eosinophils Absolute: 0.1 10*3/uL (ref 0.0–0.7)
Eosinophils Relative: 3.1 % (ref 0.0–5.0)
HCT: 38.8 % (ref 36.0–46.0)
Hemoglobin: 13.9 g/dL (ref 12.0–15.0)
Lymphocytes Relative: 34.1 % (ref 12.0–46.0)
Lymphs Abs: 1.5 10*3/uL (ref 0.7–4.0)
MCHC: 35.7 g/dL (ref 30.0–36.0)
MCV: 92 fl (ref 78.0–100.0)
Monocytes Absolute: 0.4 10*3/uL (ref 0.1–1.0)
Monocytes Relative: 9.1 % (ref 3.0–12.0)
Neutro Abs: 2.2 10*3/uL (ref 1.4–7.7)
Neutrophils Relative %: 53 % (ref 43.0–77.0)
Platelets: 201 10*3/uL (ref 150.0–400.0)
RBC: 4.22 Mil/uL (ref 3.87–5.11)
RDW: 12.3 % (ref 11.5–14.6)
WBC: 4.2 10*3/uL — ABNORMAL LOW (ref 4.5–10.5)

## 2023-01-05 LAB — COMPREHENSIVE METABOLIC PANEL
ALT: 11 U/L (ref 0–35)
AST: 15 U/L (ref 0–37)
Albumin: 4.2 g/dL (ref 3.5–5.2)
Alkaline Phosphatase: 50 U/L (ref 39–117)
BUN: 10 mg/dL (ref 6–23)
CO2: 30 mEq/L (ref 19–32)
Calcium: 9 mg/dL (ref 8.4–10.5)
Chloride: 104 mEq/L (ref 96–112)
Creatinine, Ser: 0.71 mg/dL (ref 0.40–1.20)
GFR: 122.52 mL/min (ref >60.00)
Glucose, Bld: 105 mg/dL — ABNORMAL HIGH (ref 70–99)
Potassium: 3.5 mEq/L (ref 3.5–5.1)
Sodium: 139 mEq/L (ref 135–145)
Total Bilirubin: 0.6 mg/dL (ref 0.2–1.2)
Total Protein: 7.1 g/dL (ref 6.0–8.3)

## 2023-01-05 LAB — VITAMIN B12: Vitamin B-12: 578 pg/mL (ref 211–911)

## 2023-01-05 LAB — VITAMIN D 25 HYDROXY (VIT D DEFICIENCY, FRACTURES): VITD: 23.32 ng/mL — ABNORMAL LOW (ref 30.00–100.00)

## 2023-01-05 LAB — FOLATE: Folate: >23.8 ng/mL (ref >5.9)

## 2023-01-05 NOTE — Patient Instructions (Signed)
Please go downstairs for labs before you leave today.  If you notice any more bruising, take pictures and be aware of any triggers.  We will be in touch with your results.

## 2023-01-05 NOTE — Progress Notes (Signed)
Subjective:     Patient ID: Linda Franco, female    DOB: 11-Jun-2001, 22 y.o.   MRN: [id number]  Chief Complaint  Patient presents with   Bleeding/Bruising    Bruising on legs and ankles for more than a month now, thinks October and not sure why it wont go away.     HPI Patient is in today for recent bruising on her knees and ankles.  Also reports feeling cold and tired.  History of vitamin D deficiency and low ferritin.  Denies heavy menses  Denies fever, chills, dizziness, chest pain, palpitations, abdominal pain, N/V/D, urinary symptoms, LE edema.    Health Maintenance Due  Topic Date Due   DTaP/Tdap/Td (2 - Td or Tdap) 11/09/2022    History reviewed. No pertinent past medical history.  History reviewed. No pertinent surgical history.  Family History  Problem Relation Age of Onset   High blood pressure Franco    64 / Linda Franco    Alcohol abuse Father    Drug abuse Father    Asthma Sister    Cancer Maternal Grandmother    Diabetes Maternal Grandfather    High blood pressure Maternal Grandfather    Arthritis Paternal Grandmother    High blood pressure Paternal Grandmother    Cancer Paternal Grandfather    Heart attack Paternal Grandfather    High Cholesterol Paternal Grandfather    High blood pressure Paternal Grandfather     Social History   Socioeconomic History   Marital status: Single    Spouse name: Not on file   Number of children: Not on file   Years of education: Not on file   Highest education level: Not on file  Occupational History   Not on file  Tobacco Use   Smoking status: Never   Smokeless tobacco: Never  Substance and Sexual Activity   Alcohol use: Never   Drug use: Not on file   Sexual activity: Not on file  Other Topics Concern   Not on file  Social History Narrative   Not on file   Social Determinants of Health   Financial Resource Strain: Not on file  Food Insecurity: Not on file  Transportation  Needs: Not on file  Physical Activity: Not on file  Stress: Not on file  Social Connections: Not on file  Intimate Partner Violence: Not on file    Outpatient Medications Prior to Visit  Medication Sig Dispense Refill   buPROPion (WELLBUTRIN) 75 MG tablet TAKE 1 TABLET BY MOUTH TWICE A DAY 180 tablet 1   tretinoin (RETIN-A) 3.149 % cream 1 application in the evening to face Externally Once a day for 30 days     VITAMIN D, CHOLECALCIFEROL, PO Take by mouth.     amoxicillin-clavulanate (AUGMENTIN) 875-125 MG tablet Take 1 tablet by mouth 2 (two) times daily. (Patient not taking: Reported on 01/05/2023) 20 tablet 0   Vitamin D, Ergocalciferol, (DRISDOL) 1.25 MG (50000 UNIT) CAPS capsule Take 1 capsule (50,000 Units total) by mouth every 7 (seven) days. (Patient not taking: Reported on 01/05/2023) 12 capsule 0   No facility-administered medications prior to visit.    No Known Allergies  ROS     Objective:    Physical Exam Constitutional:      Appearance: She is not toxic-appearing or diaphoretic.  HENT:     Mouth/Throat:     Mouth: Mucous membranes are moist.  Eyes:     Extraocular Movements: Extraocular movements intact.  Conjunctiva/sclera: Conjunctivae normal.  Cardiovascular:     Rate and Rhythm: Normal rate and regular rhythm.  Pulmonary:     Effort: Pulmonary effort is normal.     Breath sounds: Normal breath sounds.  Musculoskeletal:        General: Normal range of motion.     Cervical back: Normal range of motion and neck supple.     Right lower leg: No edema.     Left lower leg: No edema.  Skin:    General: Skin is warm and dry.     Capillary Refill: Capillary refill takes less than 2 seconds.     Findings: No bruising or rash.  Neurological:     General: No focal deficit present.     Mental Status: She is alert and oriented to person, place, and time.  Psychiatric:        Mood and Affect: Mood normal.        Behavior: Behavior normal.     BP 94/60 (BP  Location: Left Arm, Patient Position: Sitting, Cuff Size: Normal)   Pulse 60   Temp 97.7 F (36.5 C) (Temporal)   Ht 5' 7.5" (1.715 m)   Wt 118 lb (53.5 kg)   SpO2 99%   BMI 18.21 kg/m  Wt Readings from Last 3 Encounters:  01/05/23 118 lb (53.5 kg)  06/01/22 111 lb (50.3 kg) (17 %, Z= -0.96)*  04/15/22 113 lb (51.3 kg) (21 %, Z= -0.81)*   * Growth percentiles are based on CDC (Girls, 2-20 Years) data.       Assessment & Plan:   Problem List Items Addressed This Visit   None Visit Diagnoses     Spontaneous bruising    -  Primary   Relevant Orders   CBC with Differential/Platelet (Completed)   Sensation of feeling cold       Relevant Orders   CBC with Differential/Platelet (Completed)   Comprehensive metabolic panel (Completed)   Thyroid Panel With TSH   Fatigue, unspecified type       Relevant Orders   CBC with Differential/Platelet (Completed)   Comprehensive metabolic panel (Completed)   Vitamin B12 (Completed)   VITAMIN D 25 Hydroxy (Vit-D Deficiency, Fractures) (Completed)   Thyroid Panel With TSH   Folate (Completed)   Iron, TIBC and Ferritin Panel   Vitamin D deficiency       Relevant Orders   VITAMIN D 25 Hydroxy (Vit-D Deficiency, Fractures) (Completed)   Low serum ferritin level       Relevant Orders   Folate (Completed)   Iron, TIBC and Ferritin Panel      She is here for follow-up on vitamin D deficiency and low ferritin level. She has new concerns regarding recent bruising to bilateral knees and ankles without explanation.  No bruising on exam.  She does not have pictures of the bruises.  Discussed follow-up if bruising reoccurs and take pictures next time.  Check labs including platelets. Check labs due to fatigue and cold sensation.  I have discontinued Dymin A. Hiemstra "Linda Franco"'s Vitamin D (Ergocalciferol) and amoxicillin-clavulanate. I am also having her maintain her tretinoin, buPROPion, and (VITAMIN D, CHOLECALCIFEROL, PO).  No orders of the  defined types were placed in this encounter.

## 2023-01-06 ENCOUNTER — Ambulatory Visit: Payer: 59 | Admitting: Family Medicine

## 2023-01-06 LAB — THYROID PANEL WITH TSH
Free Thyroxine Index: 1.8 (ref 1.4–3.8)
T3 Uptake: 29 % (ref 22–35)
T4, Total: 6.1 ug/dL (ref 5.3–11.7)
TSH: 1.12 mIU/L

## 2023-01-06 LAB — IRON,TIBC AND FERRITIN PANEL
%SAT: 32 % (calc) (ref 16–45)
Ferritin: 16 ng/mL (ref 16–154)
Iron: 104 ug/dL (ref 40–190)
TIBC: 322 mcg/dL (calc) (ref 250–450)

## 2023-01-09 ENCOUNTER — Other Ambulatory Visit: Payer: Self-pay | Admitting: Family Medicine

## 2023-01-09 DIAGNOSIS — E559 Vitamin D deficiency, unspecified: Secondary | ICD-10-CM

## 2023-01-09 MED ORDER — VITAMIN D (ERGOCALCIFEROL) 1.25 MG (50000 UNIT) PO CAPS: 50000.0000 [IU] | ORAL_CAPSULE | ORAL | 0 refills | Status: AC

## 2023-01-09 NOTE — Progress Notes (Signed)
Her vitamin D is still low. I will send in a prescription for her to take once weekly and then when the prescription is finished, take an over the counter multivitamin with vitamin D3 1,000 IUs included. Her labs are fine otherwise. Normal thyroid and iron levels.

## 2023-01-13 ENCOUNTER — Encounter: Payer: 59 | Admitting: Family Medicine

## 2023-02-24 ENCOUNTER — Ambulatory Visit (INDEPENDENT_AMBULATORY_CARE_PROVIDER_SITE_OTHER): Payer: 59 | Admitting: Family Medicine

## 2023-02-24 ENCOUNTER — Encounter: Payer: Self-pay | Admitting: Family Medicine

## 2023-02-24 VITALS — BP 100/64 | HR 90 | Temp 97.6°F | Ht 67.5 in | Wt 114.0 lb

## 2023-02-24 DIAGNOSIS — Z0000 Encounter for general adult medical examination without abnormal findings: Secondary | ICD-10-CM

## 2023-02-24 DIAGNOSIS — E559 Vitamin D deficiency, unspecified: Secondary | ICD-10-CM

## 2023-02-24 DIAGNOSIS — Z23 Encounter for immunization: Secondary | ICD-10-CM

## 2023-02-24 DIAGNOSIS — Z0001 Encounter for general adult medical examination with abnormal findings: Secondary | ICD-10-CM

## 2023-02-24 NOTE — Patient Instructions (Signed)
Preventive Care 18-21 Years Old, Female Preventive care refers to lifestyle choices and visits with your health care provider that can promote health and wellness. At this stage in your life, you may start seeing a primary care physician instead of a pediatrician for your preventive care. Preventive care visits are also called wellness exams. What can I expect for my preventive care visit? Counseling During your preventive care visit, your health care provider may ask about your: Medical history, including: Past medical problems. Family medical history. Pregnancy history. Current health, including: Menstrual cycle. Method of birth control. Emotional well-being. Home life and relationship well-being. Sexual activity and sexual health. Lifestyle, including: Alcohol, nicotine or tobacco, and drug use. Access to firearms. Diet, exercise, and sleep habits. Sunscreen use. Motor vehicle safety. Physical exam Your health care provider may check your: Height and weight. These may be used to calculate your BMI (body mass index). BMI is a measurement that tells if you are at a healthy weight. Waist circumference. This measures the distance around your waistline. This measurement also tells if you are at a healthy weight and may help predict your risk of certain diseases, such as type 2 diabetes and high blood pressure. Heart rate and blood pressure. Body temperature. Skin for abnormal spots. Breasts. What immunizations do I need?  Vaccines are usually given at various ages, according to a schedule. Your health care provider will recommend vaccines for you based on your age, medical history, and lifestyle or other factors, such as travel or where you work. What tests do I need? Screening Your health care provider may recommend screening tests for certain conditions. This may include: Vision and hearing tests. Lipid and cholesterol levels. Pelvic exam and Pap test. Hepatitis B  test. Hepatitis C test. HIV (human immunodeficiency virus) test. STI (sexually transmitted infection) testing, if you are at risk. Tuberculosis skin test if you have symptoms. BRCA-related cancer screening. This may be done if you have a family history of breast, ovarian, tubal, or peritoneal cancers. Talk with your health care provider about your test results, treatment options, and if necessary, the need for more tests. Follow these instructions at home: Eating and drinking  Eat a healthy diet that includes fresh fruits and vegetables, whole grains, lean protein, and low-fat dairy products. Drink enough fluid to keep your urine pale yellow. Do not drink alcohol if: Your health care provider tells you not to drink. You are pregnant, may be pregnant, or are planning to become pregnant. You are under the legal drinking age. In the [place] the legal drinking age is 21. If you drink alcohol: Limit how much you have to 0-1 drink a day. Know how much alcohol is in your drink. In the [place] one drink equals one 12 oz bottle of beer (355 mL), one 5 oz glass of wine (148 mL), or one 1 oz glass of hard liquor (44 mL). Lifestyle Brush your teeth every morning and night with fluoride toothpaste. Floss one time each day. Exercise for at least 30 minutes 5 or more days of the week. Do not use any products that contain nicotine or tobacco. These products include cigarettes, chewing tobacco, and vaping devices, such as e-cigarettes. If you need help quitting, ask your health care provider. Do not use drugs. If you are sexually active, practice safe sex. Use a condom or other form of protection to prevent STIs. If you do not wish to become pregnant, use a form of birth control. If you plan to become pregnant,   see your health care provider for a prepregnancy visit. Find healthy ways to manage stress, such as: Meditation, yoga, or listening to music. Journaling. Talking to a trusted person. Spending time  with friends and family. Safety Always wear your seat belt while driving or riding in a vehicle. Do not drive: If you have been drinking alcohol. Do not ride with someone who has been drinking. When you are tired or distracted. While texting. If you have been using any mind-altering substances or drugs. Wear a helmet and other protective equipment during sports activities. If you have firearms in your house, make sure you follow all gun safety procedures. Seek help if you have been bullied, physically abused, or sexually abused. Use the internet responsibly to avoid dangers, such as online bullying and online sex predators. What's next? Go to your health care provider once a year for an annual wellness visit. Ask your health care provider how often you should have your eyes and teeth checked. Stay up to date on all vaccines. This information is not intended to replace advice given to you by your health care provider. Make sure you discuss any questions you have with your health care provider. Document Revised: 05/19/2021 Document Reviewed: 05/19/2021 Elsevier Patient Education  2023 [employer] Inc[employer]  

## 2023-02-24 NOTE — Progress Notes (Unsigned)
Complete physical exam  Patient: Linda Franco   DOB: [birth year]   21 y.o. Female  MRN: [id number]  Subjective:    Chief Complaint  Patient presents with   Annual Exam   She is here for a complete physical exam.  She is taking Wellbutrin for depression. She sees a Engineer, water, Marine scientist.   Denies suicidal thoughts.   Appetite normal.   Denies alcohol, smoking or drug use.   LMP: last week  Regular  Declines STD testing   States Tdap has been than 10 years.     Health Maintenance  Topic Date Due   Flu Shot  03/05/2023*   COVID-19 Vaccine (1) 03/12/2023*   HPV Vaccine (1 - 2-dose series) 01/06/2024*   Hepatitis C Screening: USPSTF Recommendation to screen - Ages 18-79 yo.  01/06/2024*   HIV Screening  01/06/2024*   DTaP/Tdap/Td vaccine (3 - Td or Tdap) 02/23/2033  *Topic was postponed. The date shown is not the original due date.    Wears seatbelt always, uses sunscreen, smoke detectors in home and functioning, does not text while driving, feels safe in home environment.  Depression screening:    02/24/2023   10:51 AM 04/15/2022   10:57 AM 01/30/2021    2:58 AM  Depression screen PHQ 2/9  Decreased Interest 3 1 2   Down, Depressed, Hopeless 3 3 3   PHQ - 2 Score 6 4 5   Altered sleeping 3 3 2   Tired, decreased energy 2 1 2   Change in appetite 3 2 2   Feeling bad or failure about yourself  2 3 3   Trouble concentrating 2 2 2   Moving slowly or fidgety/restless 0 0 0  Suicidal thoughts 2 1 1   PHQ-9 Score 20 16 17   Difficult doing work/chores Very difficult Somewhat difficult Somewhat difficult   Anxiety Screening:    02/24/2023   10:51 AM  GAD 7 : Generalized Anxiety Score  Nervous, Anxious, on Edge 2  Control/stop worrying 3  Worry too much - different things 3  Trouble relaxing 3  Restless 2  Easily annoyed or irritable 3  Afraid - awful might happen 3  Total GAD 7 Score 19  Anxiety Difficulty Very difficult    Dental: No current dental problems and  Receives regular dental care and STD: declines and denies being sexually active   Patient Active Problem List   Diagnosis Date Noted   MDD (major depressive disorder), recurrent episode, severe ([place]) 01/30/2021   Major depression 01/30/2021   ADHD (attention deficit hyperactivity disorder), inattentive type 09/30/2019   Depression 05/20/2019   Anxiety disorder 05/20/2019   History reviewed. No pertinent past medical history. History reviewed. No pertinent surgical history.    Patient Care Team: Girtha Rm, NP-C as PCP - General (Family Medicine)   Outpatient Medications Prior to Visit  Medication Sig   buPROPion (WELLBUTRIN) 75 MG tablet TAKE 1 TABLET BY MOUTH TWICE A DAY   tretinoin (RETIN-A) Q000111Q % cream 1 application in the evening to face Externally Once a day for 30 days   VITAMIN D, CHOLECALCIFEROL, PO Take by mouth.   Vitamin D, Ergocalciferol, (DRISDOL) 1.25 MG (50000 UNIT) CAPS capsule Take 1 capsule (50,000 Units total) by mouth every 7 (seven) days.   No facility-administered medications prior to visit.    Review of Systems  Constitutional:  Negative for chills and fever.  HENT:  Negative for congestion, ear pain, sinus pain and sore throat.   Eyes:  Negative for blurred vision, double vision  and pain.  Respiratory:  Negative for cough, shortness of breath and wheezing.   Cardiovascular:  Negative for chest pain, palpitations and leg swelling.  Gastrointestinal:  Negative for abdominal pain, constipation, diarrhea, nausea and vomiting.  Genitourinary:  Negative for dysuria, frequency and urgency.  Musculoskeletal:  Negative for back pain, joint pain and myalgias.  Skin:  Negative for rash.  Neurological:  Negative for dizziness, tingling, focal weakness and headaches.       Objective:    BP 100/64 (BP Location: Left Arm, Patient Position: Sitting, Cuff Size: Normal)   Pulse 90   Temp 97.6 F (36.4 C) (Temporal)   Ht 5' 7.5" (1.715 m)   Wt 114 lb (51.7  kg)   SpO2 98%   BMI 17.59 kg/m  BP Readings from Last 3 Encounters:  02/24/23 100/64  01/05/23 94/60  06/01/22 108/70   Wt Readings from Last 3 Encounters:  02/24/23 114 lb (51.7 kg)  01/05/23 118 lb (53.5 kg)  06/01/22 111 lb (50.3 kg) (17 %, Z= -0.96)*   * Growth percentiles are based on CDC (Girls, 2-20 Years) data.    Physical Exam Constitutional:      General: She is not in acute distress. HENT:     Right Ear: Tympanic membrane, ear canal and external ear normal.     Left Ear: Tympanic membrane, ear canal and external ear normal.     Nose: Nose normal.     Mouth/Throat:     Mouth: Mucous membranes are moist.     Pharynx: Oropharynx is clear.  Eyes:     Extraocular Movements: Extraocular movements intact.     Conjunctiva/sclera: Conjunctivae normal.     Pupils: Pupils are equal, round, and reactive to light.  Neck:     Thyroid: No thyroid mass, thyromegaly or thyroid tenderness.  Cardiovascular:     Rate and Rhythm: Normal rate and regular rhythm.     Pulses: Normal pulses.  Pulmonary:     Effort: Pulmonary effort is normal.     Breath sounds: Normal breath sounds.  Abdominal:     General: Bowel sounds are normal.     Palpations: Abdomen is soft.     Tenderness: There is no abdominal tenderness. There is no right CVA tenderness, left CVA tenderness, guarding or rebound.  Musculoskeletal:        General: Normal range of motion.     Cervical back: Normal range of motion and neck supple. No tenderness.     Right lower leg: No edema.     Left lower leg: No edema.  Lymphadenopathy:     Cervical: No cervical adenopathy.  Skin:    General: Skin is warm and dry.  Neurological:     General: No focal deficit present.     Mental Status: She is alert and oriented to person, place, and time.     Cranial Nerves: No cranial nerve deficit.     Sensory: No sensory deficit.     Motor: No weakness.     Gait: Gait normal.  Psychiatric:        Mood and Affect: Mood normal.         Behavior: Behavior normal.        Thought Content: Thought content normal.      No results found for any visits on 02/24/23.    Assessment & Plan:    Routine Health Maintenance and Physical Exam  Problem List Items Addressed This Visit   None Visit Diagnoses  Encounter for general adult medical examination with abnormal findings    -  Primary   Vitamin D deficiency       Need for diphtheria-tetanus-pertussis (Tdap) vaccine          Reports being in her usual state of health.  Preventive health care reviewed.  Counseling on healthy lifestyle including diet and exercise.  Recommend regular dental and eye exams.  Immunizations reviewed.  Discussed safety. Continue vitamin D  Tdap given. Counseling on potential side effects.    Return in about 1 year (around 02/24/2024).     Harland Dingwall, NP-C

## 2023-04-10 ENCOUNTER — Telehealth: Payer: Self-pay | Admitting: Family Medicine

## 2023-04-10 NOTE — Telephone Encounter (Signed)
Records printed and placed up front for pick up. LM for pt letting her know they have been placed up front

## 2023-04-10 NOTE — Telephone Encounter (Signed)
Patient would like her immunization records printed and left at the front desk.  Please call patient when this has been completed

## 2023-04-12 ENCOUNTER — Institutional Professional Consult (permissible substitution): Payer: 59 | Admitting: Family
